# Patient Record
Sex: Male | Born: 1970 | Race: White | Hispanic: No | Marital: Married | Smoking: Current every day smoker
Health system: Southern US, Community
[De-identification: ages and names within clinical notes are randomized; demographics above are authoritative.]

## PROBLEM LIST (undated history)

## (undated) DIAGNOSIS — K3580 Unspecified acute appendicitis: Secondary | ICD-10-CM

## (undated) DIAGNOSIS — T884XXA Failed or difficult intubation, initial encounter: Secondary | ICD-10-CM

## (undated) HISTORY — DX: Unspecified acute appendicitis: K35.80

---

## 2015-05-06 DIAGNOSIS — R29898 Other symptoms and signs involving the musculoskeletal system: Secondary | ICD-10-CM | POA: Insufficient documentation

## 2017-08-04 ENCOUNTER — Observation Stay
Admission: EM | Admit: 2017-08-04 | Discharge: 2017-08-07 | Disposition: A | Discharge status: Home / Self Care | Payer: Managed Care, Other (non HMO) | Attending: Surgery | Admitting: Surgery

## 2017-08-04 ENCOUNTER — Other Ambulatory Visit: Payer: Self-pay

## 2017-08-04 DIAGNOSIS — K3580 Unspecified acute appendicitis: Secondary | ICD-10-CM | POA: Diagnosis present

## 2017-08-04 DIAGNOSIS — K353 Acute appendicitis with localized peritonitis, without perforation or gangrene: Secondary | ICD-10-CM

## 2017-08-04 DIAGNOSIS — G8929 Other chronic pain: Secondary | ICD-10-CM | POA: Insufficient documentation

## 2017-08-04 DIAGNOSIS — Z6838 Body mass index (BMI) 38.0-38.9, adult: Secondary | ICD-10-CM | POA: Diagnosis not present

## 2017-08-04 DIAGNOSIS — K219 Gastro-esophageal reflux disease without esophagitis: Secondary | ICD-10-CM | POA: Diagnosis not present

## 2017-08-04 DIAGNOSIS — E669 Obesity, unspecified: Secondary | ICD-10-CM | POA: Diagnosis not present

## 2017-08-04 DIAGNOSIS — K3532 Acute appendicitis with perforation and localized peritonitis, without abscess: Principal | ICD-10-CM | POA: Insufficient documentation

## 2017-08-04 DIAGNOSIS — J449 Chronic obstructive pulmonary disease, unspecified: Secondary | ICD-10-CM | POA: Insufficient documentation

## 2017-08-04 DIAGNOSIS — F1911 Other psychoactive substance abuse, in remission: Secondary | ICD-10-CM | POA: Insufficient documentation

## 2017-08-04 DIAGNOSIS — F17210 Nicotine dependence, cigarettes, uncomplicated: Secondary | ICD-10-CM | POA: Insufficient documentation

## 2017-08-04 HISTORY — DX: Failed or difficult intubation, initial encounter: T88.4XXA

## 2017-08-04 LAB — COMPREHENSIVE METABOLIC PANEL
ALT: 25 U/L (ref 17–63)
ANION GAP: 13 (ref 5–15)
AST: 26 U/L (ref 15–41)
Albumin: 4.3 g/dL (ref 3.5–5.0)
Alkaline Phosphatase: 63 U/L (ref 38–126)
BILIRUBIN TOTAL: 0.4 mg/dL (ref 0.3–1.2)
BUN: 16 mg/dL (ref 6–20)
CALCIUM: 9.1 mg/dL (ref 8.9–10.3)
CO2: 20 mmol/L — AB (ref 22–32)
Chloride: 105 mmol/L (ref 101–111)
Creatinine, Ser: 0.99 mg/dL (ref 0.61–1.24)
Glucose, Bld: 143 mg/dL — ABNORMAL HIGH (ref 65–99)
Potassium: 3.9 mmol/L (ref 3.5–5.1)
SODIUM: 138 mmol/L (ref 135–145)
TOTAL PROTEIN: 7.6 g/dL (ref 6.5–8.1)

## 2017-08-04 LAB — URINALYSIS, COMPLETE (UACMP) WITH MICROSCOPIC
Bilirubin Urine: NEGATIVE
GLUCOSE, UA: NEGATIVE mg/dL
HGB URINE DIPSTICK: NEGATIVE
KETONES UR: 5 mg/dL — AB
LEUKOCYTES UA: NEGATIVE
NITRITE: NEGATIVE
PH: 5 (ref 5.0–8.0)
Protein, ur: NEGATIVE mg/dL
Specific Gravity, Urine: 1.028 (ref 1.005–1.030)

## 2017-08-04 LAB — CBC
HCT: 46.7 % (ref 40.0–52.0)
HEMOGLOBIN: 16.1 g/dL (ref 13.0–18.0)
MCH: 29.8 pg (ref 26.0–34.0)
MCHC: 34.6 g/dL (ref 32.0–36.0)
MCV: 86.2 fL (ref 80.0–100.0)
Platelets: 202 10*3/uL (ref 150–440)
RBC: 5.42 MIL/uL (ref 4.40–5.90)
RDW: 13 % (ref 11.5–14.5)
WBC: 15.2 10*3/uL — AB (ref 3.8–10.6)

## 2017-08-04 LAB — LIPASE, BLOOD: Lipase: 33 U/L (ref 11–51)

## 2017-08-04 MED ORDER — ONDANSETRON HCL 4 MG/2ML IJ SOLN
4.0000 mg | Freq: Once | INTRAMUSCULAR | Status: AC | PRN
  Administered 2017-08-04: 4 mg via INTRAVENOUS
  Filled 2017-08-04: qty 2

## 2017-08-04 NOTE — ED Provider Notes (Signed)
[facility] Emergency Department Provider Note  ____________________________________________   First MD Initiated Contact with Patient 08/04/17 2328     (approximate)  I have reviewed the triage vital signs and the nursing notes.   HISTORY  Chief Complaint Abdominal Pain    HPI Robert Dean is a 47 y.o. male who comes to the emergency department with left lower quadrant pain that began insidiously around 2 PM.  Patient's pain has been slowly progressive and is now severe.  It is associated with nausea but no vomiting.  He also reports left flank pain.    It is colicky.  Nothing particular seems to make it better or worse.  He has no history of abdominal surgeries.  He last ate anything at 1 PM.  No past medical history on file.  There are no active problems to display for this patient.     Prior to Admission medications   Not on File    Allergies Patient has no known allergies.  No family history on file.  Social History Social History   Tobacco Use  . Smoking status: Not on file  Substance Use Topics  . Alcohol use: Not on file  . Drug use: Not on file    Review of Systems Constitutional: No fever/chills Eyes: No visual changes. ENT: No sore throat. Cardiovascular: Denies chest pain. Respiratory: Denies shortness of breath. Gastrointestinal: Positive for abdominal pain.  Positive for nausea, no vomiting.  No diarrhea.  No constipation. Genitourinary: Negative for dysuria. Musculoskeletal: Positive for back pain. Skin: Negative for rash. Neurological: Negative for headaches, focal weakness or numbness.   ____________________________________________   PHYSICAL EXAM:  VITAL SIGNS: ED Triage Vitals  Enc Vitals Group     BP 08/04/17 2246 (!) 154/88     Pulse Rate 08/04/17 2246 80     Resp 08/04/17 2246 (!) 22     Temp 08/04/17 2246 97.7 F (36.5 C)     Temp Source 08/04/17 2246 Oral     SpO2 08/04/17 2246 95 %     Weight  08/04/17 2247 295 lb (133.8 kg)     Height 08/04/17 2247 6\' 2"  (1.88 m)     Head Circumference --      Peak Flow --      Pain Score 08/04/17 2246 10     Pain Loc --      Pain Edu? --      Excl. in GC? --     Constitutional: Alert and oriented x4 turns onto his right side holding his left lower quadrant appears miserable Eyes: PERRL EOMI. Head: Atraumatic. Nose: No congestion/rhinnorhea. Mouth/Throat: No trismus Neck: No stridor.   Cardiovascular: Normal rate, regular rhythm. Grossly normal heart sounds.  Good peripheral circulation. Respiratory: Normal respiratory effort.  No retractions. Lungs CTAB and moving good air Gastrointestinal: Morbidly obese soft nontender no rebound or guarding no peritonitis no McBurney's tenderness negative Rovsing's no costovertebral tenderness Musculoskeletal: No lower extremity edema   Neurologic:  Normal speech and language. No gross focal neurologic deficits are appreciated. Skin:  Skin is warm, dry and intact. No rash noted. Psychiatric: Mood and affect are normal. Speech and behavior are normal.    ____________________________________________   DIFFERENTIAL includes but not limited to  Renal colic, appendicitis, diverticulitis, pyelonephritis, infected stone ____________________________________________   LABS (all labs ordered are listed, but only abnormal results are displayed)  Labs Reviewed  COMPREHENSIVE METABOLIC PANEL - Abnormal; Notable for the following components:  Result Value   CO2 20 (*)    Glucose, Bld 143 (*)    All other components within normal limits  CBC - Abnormal; Notable for the following components:   WBC 15.2 (*)    All other components within normal limits  URINALYSIS, COMPLETE (UACMP) WITH MICROSCOPIC - Abnormal; Notable for the following components:   Color, Urine YELLOW (*)    APPearance CLEAR (*)    Ketones, ur 5 (*)    Bacteria, UA RARE (*)    Squamous Epithelial / LPF 0-5 (*)    All other  components within normal limits  CULTURE, BLOOD (ROUTINE X 2)  CULTURE, BLOOD (ROUTINE X 2)  LIPASE, BLOOD  TYPE AND SCREEN    Lab work reviewed by me with elevated white count which is nonspecific __________________________________________  EKG  ED ECG REPORT I, Merrily BrittleNeil Glyn Gerads, the attending physician, personally viewed and interpreted this ECG.  Date: 08/04/2017 EKG Time:  Rate: 80 Rhythm: normal sinus rhythm QRS Axis: normal Intervals: normal ST/T Wave abnormalities: normal Narrative Interpretation: no evidence of acute ischemia.  Poor R wave progression  ____________________________________________  RADIOLOGY CT abdomen pelvis concerning for acute appendicitis  ____________________________________________   PROCEDURES  Procedure(s) performed: no  Procedures  Critical Care performed: no  Observation: no ____________________________________________   INITIAL IMPRESSION / ASSESSMENT AND PLAN / ED COURSE  Pertinent labs & imaging results that were available during my care of the patient were reviewed by me and considered in my medical decision making (see chart for details).  The patient appears miserable splinting holding his left lower quadrant.  Differential is broad but includes nephrolithiasis as well as diverticulitis and appendicitis.  Pain control and CT scan are pending.    ----------------------------------------- 1:32 AM on 08/05/2017 -----------------------------------------  CT abdomen pelvis is just back concerning for acute appendicitis.  I have consulted general surgeon Dr. Excell Seltzerooper who will kindly come consult on the case.  The patient has not eaten in 12 hours.  I will give him fluids ceftriaxone Flagyl and check a type and screen now. ____________________________________________  Dr. Excell Seltzerooper who is graciously agreed to admit the patient to his service.  FINAL CLINICAL IMPRESSION(S) / ED DIAGNOSES  Final diagnoses:  Acute appendicitis,  unspecified acute appendicitis type      NEW MEDICATIONS STARTED DURING THIS VISIT:  This SmartLink is deprecated. Use AVSMEDLIST instead to display the medication list for a patient.   Note:  This document was prepared using Dragon voice recognition software and may include unintentional dictation errors.     Merrily Brittleifenbark, Noell Shular, MD 08/05/17 [phone]

## 2017-08-04 NOTE — ED Notes (Signed)
Pt states he does have nausea when pain increases but denies and vomiting or diarrhea.

## 2017-08-04 NOTE — ED Triage Notes (Signed)
[facility] for abdominal pain since 2:30 pm.  Described as burning with nausea.  EMS place saline loc to left hand with 20g angiocath.  VS:  HR - 70; BP 174 94: pulse oxi 96% on room air, CBG 139; 12 lead with in normal limits.

## 2017-08-05 ENCOUNTER — Observation Stay: Payer: Managed Care, Other (non HMO) | Admitting: Anesthesiology

## 2017-08-05 ENCOUNTER — Other Ambulatory Visit: Payer: Self-pay

## 2017-08-05 ENCOUNTER — Encounter
Admission: EM | Disposition: A | Discharge status: Home / Self Care | Payer: Self-pay | Source: Home / Self Care | Attending: Emergency Medicine

## 2017-08-05 ENCOUNTER — Emergency Department: Payer: Managed Care, Other (non HMO)

## 2017-08-05 DIAGNOSIS — K353 Acute appendicitis with localized peritonitis, without perforation or gangrene: Secondary | ICD-10-CM

## 2017-08-05 DIAGNOSIS — K3580 Unspecified acute appendicitis: Secondary | ICD-10-CM

## 2017-08-05 HISTORY — PX: APPENDECTOMY: SHX54

## 2017-08-05 HISTORY — PX: LAPAROSCOPIC APPENDECTOMY: SHX408

## 2017-08-05 LAB — SURGICAL PCR SCREEN
MRSA, PCR: NEGATIVE
Staphylococcus aureus: NEGATIVE

## 2017-08-05 LAB — TYPE AND SCREEN
ABO/RH(D): A POS
ANTIBODY SCREEN: NEGATIVE

## 2017-08-05 SURGERY — APPENDECTOMY, LAPAROSCOPIC
Anesthesia: General

## 2017-08-05 MED ORDER — DEXAMETHASONE SODIUM PHOSPHATE 10 MG/ML IJ SOLN
INTRAMUSCULAR | Status: AC
  Filled 2017-08-05: qty 1

## 2017-08-05 MED ORDER — DEXAMETHASONE SODIUM PHOSPHATE 10 MG/ML IJ SOLN
INTRAMUSCULAR | Status: DC | PRN
  Administered 2017-08-05: 10 mg via INTRAVENOUS

## 2017-08-05 MED ORDER — ENOXAPARIN SODIUM 40 MG/0.4ML ~~LOC~~ SOLN
40.0000 mg | SUBCUTANEOUS | Status: DC
  Administered 2017-08-06 – 2017-08-07 (×2): 40 mg via SUBCUTANEOUS
  Filled 2017-08-05 (×2): qty 0.4

## 2017-08-05 MED ORDER — OXYCODONE HCL 5 MG PO TABS: 5.0000 mg | ORAL_TABLET | Freq: Once | ORAL | Status: DC | PRN

## 2017-08-05 MED ORDER — EPHEDRINE SULFATE 50 MG/ML IJ SOLN
INTRAMUSCULAR | Status: AC
  Filled 2017-08-05: qty 1

## 2017-08-05 MED ORDER — ONDANSETRON HCL 4 MG/2ML IJ SOLN
4.0000 mg | Freq: Four times a day (QID) | INTRAMUSCULAR | Status: DC | PRN
  Administered 2017-08-05 (×2): 4 mg via INTRAVENOUS
  Filled 2017-08-05: qty 2

## 2017-08-05 MED ORDER — SUCCINYLCHOLINE CHLORIDE 20 MG/ML IJ SOLN
INTRAMUSCULAR | Status: DC | PRN
  Administered 2017-08-05: 100 mg via INTRAVENOUS

## 2017-08-05 MED ORDER — BUPIVACAINE HCL (PF) 0.5 % IJ SOLN
INTRAMUSCULAR | Status: AC
  Filled 2017-08-05: qty 30

## 2017-08-05 MED ORDER — GLYCOPYRROLATE 0.2 MG/ML IJ SOLN
INTRAMUSCULAR | Status: AC
  Filled 2017-08-05: qty 1

## 2017-08-05 MED ORDER — ROCURONIUM BROMIDE 50 MG/5ML IV SOLN
INTRAVENOUS | Status: AC
  Filled 2017-08-05: qty 1

## 2017-08-05 MED ORDER — FENTANYL CITRATE (PF) 100 MCG/2ML IJ SOLN: 25.0000 ug | INTRAMUSCULAR | Status: DC | PRN

## 2017-08-05 MED ORDER — PROPOFOL 10 MG/ML IV BOLUS
INTRAVENOUS | Status: AC
  Filled 2017-08-05: qty 40

## 2017-08-05 MED ORDER — ONDANSETRON 4 MG PO TBDP
4.0000 mg | ORAL_TABLET | Freq: Four times a day (QID) | ORAL | Status: DC | PRN
Start: 2017-08-05 — End: 2017-08-07

## 2017-08-05 MED ORDER — SUGAMMADEX SODIUM 500 MG/5ML IV SOLN
INTRAVENOUS | Status: DC | PRN
  Administered 2017-08-05: 200 mg via INTRAVENOUS

## 2017-08-05 MED ORDER — HEPARIN SODIUM (PORCINE) 5000 UNIT/ML IJ SOLN
5000.0000 [IU] | Freq: Three times a day (TID) | INTRAMUSCULAR | Status: DC
  Administered 2017-08-05: 5000 [IU] via SUBCUTANEOUS
  Filled 2017-08-05: qty 1

## 2017-08-05 MED ORDER — ACETAMINOPHEN 650 MG RE SUPP
650.0000 mg | Freq: Four times a day (QID) | RECTAL | Status: DC | PRN
  Filled 2017-08-05: qty 1

## 2017-08-05 MED ORDER — DEXTROSE IN LACTATED RINGERS 5 % IV SOLN
INTRAVENOUS | Status: DC
  Administered 2017-08-05: 04:00:00 via INTRAVENOUS
  Filled 2017-08-05 (×2): qty 1000

## 2017-08-05 MED ORDER — OXYCODONE HCL 5 MG/5ML PO SOLN: 5.0000 mg | Freq: Once | ORAL | Status: DC | PRN

## 2017-08-05 MED ORDER — DEXTROSE 5 % IV SOLN
1.0000 g | Freq: Once | INTRAVENOUS | Status: AC
  Administered 2017-08-05: 1 g via INTRAVENOUS
  Filled 2017-08-05: qty 10

## 2017-08-05 MED ORDER — BUPIVACAINE HCL 0.5 % IJ SOLN
INTRAMUSCULAR | Status: DC | PRN
  Administered 2017-08-05: 20 mL

## 2017-08-05 MED ORDER — FENTANYL CITRATE (PF) 100 MCG/2ML IJ SOLN
INTRAMUSCULAR | Status: AC
  Filled 2017-08-05: qty 2

## 2017-08-05 MED ORDER — MORPHINE SULFATE (PF) 2 MG/ML IV SOLN: 2.0000 mg | INTRAVENOUS | Status: DC | PRN

## 2017-08-05 MED ORDER — LIDOCAINE HCL (CARDIAC) 20 MG/ML IV SOLN
INTRAVENOUS | Status: DC | PRN
  Administered 2017-08-05: 100 mg via INTRAVENOUS

## 2017-08-05 MED ORDER — SODIUM CHLORIDE 0.9 % IV BOLUS (SEPSIS)
1000.0000 mL | Freq: Once | INTRAVENOUS | Status: AC
  Administered 2017-08-05: 1000 mL via INTRAVENOUS

## 2017-08-05 MED ORDER — KETOROLAC TROMETHAMINE 30 MG/ML IJ SOLN
15.0000 mg | Freq: Once | INTRAMUSCULAR | Status: AC
  Administered 2017-08-05: 15 mg via INTRAVENOUS
  Filled 2017-08-05: qty 1

## 2017-08-05 MED ORDER — OXYCODONE-ACETAMINOPHEN 5-325 MG PO TABS
1.0000 | ORAL_TABLET | ORAL | Status: DC | PRN
  Administered 2017-08-05 – 2017-08-07 (×5): 1 via ORAL
  Filled 2017-08-05: qty 2
  Filled 2017-08-05 (×4): qty 1

## 2017-08-05 MED ORDER — ROCURONIUM BROMIDE 100 MG/10ML IV SOLN
INTRAVENOUS | Status: DC | PRN
  Administered 2017-08-05: 50 mg via INTRAVENOUS

## 2017-08-05 MED ORDER — LIDOCAINE HCL (PF) 2 % IJ SOLN
INTRAMUSCULAR | Status: AC
  Filled 2017-08-05: qty 10

## 2017-08-05 MED ORDER — ONDANSETRON HCL 4 MG/2ML IJ SOLN: 4.0000 mg | Freq: Four times a day (QID) | INTRAMUSCULAR | Status: DC | PRN

## 2017-08-05 MED ORDER — KCL IN DEXTROSE-NACL 20-5-0.45 MEQ/L-%-% IV SOLN
INTRAVENOUS | Status: DC
  Administered 2017-08-05 – 2017-08-07 (×5): via INTRAVENOUS
  Filled 2017-08-05 (×8): qty 1000

## 2017-08-05 MED ORDER — ONDANSETRON HCL 4 MG/2ML IJ SOLN
INTRAMUSCULAR | Status: AC
  Filled 2017-08-05: qty 2

## 2017-08-05 MED ORDER — ONDANSETRON HCL 4 MG PO TABS: 4.0000 mg | ORAL_TABLET | Freq: Four times a day (QID) | ORAL | Status: DC | PRN

## 2017-08-05 MED ORDER — METRONIDAZOLE IN NACL 5-0.79 MG/ML-% IV SOLN
500.0000 mg | Freq: Three times a day (TID) | INTRAVENOUS | Status: DC
  Administered 2017-08-05 – 2017-08-07 (×6): 500 mg via INTRAVENOUS
  Filled 2017-08-05 (×8): qty 100

## 2017-08-05 MED ORDER — KETOROLAC TROMETHAMINE 30 MG/ML IJ SOLN
30.0000 mg | Freq: Four times a day (QID) | INTRAMUSCULAR | Status: DC
  Administered 2017-08-05 – 2017-08-07 (×8): 30 mg via INTRAVENOUS
  Filled 2017-08-05 (×8): qty 1

## 2017-08-05 MED ORDER — ONDANSETRON HCL 4 MG/2ML IJ SOLN
4.0000 mg | Freq: Once | INTRAMUSCULAR | Status: AC
Start: 2017-08-05 — End: 2017-08-05
  Administered 2017-08-05: 4 mg via INTRAVENOUS
  Filled 2017-08-05: qty 2

## 2017-08-05 MED ORDER — SUCCINYLCHOLINE CHLORIDE 20 MG/ML IJ SOLN
INTRAMUSCULAR | Status: AC
  Filled 2017-08-05: qty 1

## 2017-08-05 MED ORDER — CEFTRIAXONE SODIUM IN DEXTROSE 20 MG/ML IV SOLN
1.0000 g | Freq: Once | INTRAVENOUS | Status: AC
  Administered 2017-08-05: 1 g via INTRAVENOUS
  Filled 2017-08-05: qty 50

## 2017-08-05 MED ORDER — SEVOFLURANE IN SOLN
RESPIRATORY_TRACT | Status: AC
  Filled 2017-08-05: qty 250

## 2017-08-05 MED ORDER — LACTATED RINGERS IV SOLN
INTRAVENOUS | Status: DC | PRN
  Administered 2017-08-05: 08:00:00 via INTRAVENOUS

## 2017-08-05 MED ORDER — ACETAMINOPHEN 325 MG PO TABS
650.0000 mg | ORAL_TABLET | Freq: Four times a day (QID) | ORAL | Status: DC | PRN
  Administered 2017-08-05: 650 mg via ORAL
  Filled 2017-08-05: qty 2

## 2017-08-05 MED ORDER — PHENYLEPHRINE HCL 10 MG/ML IJ SOLN
INTRAMUSCULAR | Status: DC | PRN
  Administered 2017-08-05 (×3): 100 ug via INTRAVENOUS

## 2017-08-05 MED ORDER — MORPHINE SULFATE (PF) 4 MG/ML IV SOLN
4.0000 mg | INTRAVENOUS | Status: DC | PRN
  Administered 2017-08-05 (×2): 4 mg via INTRAVENOUS
  Filled 2017-08-05 (×2): qty 1

## 2017-08-05 MED ORDER — METRONIDAZOLE IN NACL 5-0.79 MG/ML-% IV SOLN
500.0000 mg | Freq: Once | INTRAVENOUS | Status: AC
  Administered 2017-08-05: 500 mg via INTRAVENOUS
  Filled 2017-08-05: qty 100

## 2017-08-05 MED ORDER — DEXTROSE 5 % IV SOLN
1.0000 g | INTRAVENOUS | Status: DC
  Filled 2017-08-05: qty 10

## 2017-08-05 MED ORDER — CEFTRIAXONE SODIUM 2 G IJ SOLR
2.0000 g | INTRAMUSCULAR | Status: DC
  Administered 2017-08-05 – 2017-08-06 (×2): 2 g via INTRAVENOUS
  Filled 2017-08-05 (×3): qty 2

## 2017-08-05 MED ORDER — LIDOCAINE HCL (PF) 1 % IJ SOLN
INTRAMUSCULAR | Status: AC
  Filled 2017-08-05: qty 30

## 2017-08-05 MED ORDER — MORPHINE SULFATE (PF) 10 MG/ML IV SOLN
10.0000 mg | Freq: Once | INTRAVENOUS | Status: AC
  Administered 2017-08-05: 10 mg via INTRAVENOUS
  Filled 2017-08-05: qty 1

## 2017-08-05 MED ORDER — PHENYLEPHRINE HCL 10 MG/ML IJ SOLN
INTRAMUSCULAR | Status: AC
  Filled 2017-08-05: qty 1

## 2017-08-05 MED ORDER — SUGAMMADEX SODIUM 200 MG/2ML IV SOLN
INTRAVENOUS | Status: AC
  Filled 2017-08-05: qty 2

## 2017-08-05 MED ORDER — PROPOFOL 10 MG/ML IV BOLUS
INTRAVENOUS | Status: DC | PRN
  Administered 2017-08-05: 180 mg via INTRAVENOUS

## 2017-08-05 SURGICAL SUPPLY — 42 items
BLADE SURG SZ11 CARB STEEL (BLADE) ×3 IMPLANT
CANISTER SUCT 1200ML W/VALVE (MISCELLANEOUS) ×3 IMPLANT
CANISTER SUCT 3000ML PPV (MISCELLANEOUS) IMPLANT
CHLORAPREP W/TINT 26ML (MISCELLANEOUS) ×3 IMPLANT
CUTTER FLEX LINEAR 45M (STAPLE) ×3 IMPLANT
DECANTER SPIKE VIAL GLASS SM (MISCELLANEOUS) IMPLANT
DERMABOND ADVANCED (GAUZE/BANDAGES/DRESSINGS) ×2
DERMABOND ADVANCED .7 DNX12 (GAUZE/BANDAGES/DRESSINGS) ×1 IMPLANT
ELECT REM PT RETURN 9FT ADLT (ELECTROSURGICAL) ×3
ELECTRODE REM PT RTRN 9FT ADLT (ELECTROSURGICAL) ×1 IMPLANT
GLOVE BIO SURGEON STRL SZ7 (GLOVE) ×9 IMPLANT
GLOVE BIOGEL PI IND STRL 7.5 (GLOVE) ×1 IMPLANT
GLOVE BIOGEL PI INDICATOR 7.5 (GLOVE) ×2
GOWN STRL REUS W/ TWL LRG LVL4 (GOWN DISPOSABLE) ×1 IMPLANT
GOWN STRL REUS W/ TWL XL LVL3 (GOWN DISPOSABLE) ×1 IMPLANT
GOWN STRL REUS W/TWL LRG LVL4 (GOWN DISPOSABLE) ×2
GOWN STRL REUS W/TWL XL LVL3 (GOWN DISPOSABLE) ×2
GRASPER SUT TROCAR 14GX15 (MISCELLANEOUS) ×3 IMPLANT
IRRIGATION STRYKERFLOW (MISCELLANEOUS) IMPLANT
IRRIGATOR STRYKERFLOW (MISCELLANEOUS)
IV NS 1000ML (IV SOLUTION) ×2
IV NS 1000ML BAXH (IV SOLUTION) ×1 IMPLANT
KIT RM TURNOVER STRD PROC AR (KITS) ×3 IMPLANT
LINER CANISTER 1000CC FLEX (MISCELLANEOUS) IMPLANT
NEEDLE HYPO 22GX1.5 SAFETY (NEEDLE) ×3 IMPLANT
NEEDLE INSUFFLATION 14GA 120MM (NEEDLE) ×3 IMPLANT
NS IRRIG 1000ML POUR BTL (IV SOLUTION) ×3 IMPLANT
PACK LAP CHOLECYSTECTOMY (MISCELLANEOUS) ×3 IMPLANT
POUCH SPECIMEN RETRIEVAL 10MM (ENDOMECHANICALS) ×3 IMPLANT
RELOAD 45 VASCULAR/THIN (ENDOMECHANICALS) IMPLANT
RELOAD STAPLE TA45 3.5 REG BLU (ENDOMECHANICALS) ×3 IMPLANT
SHEARS HARMONIC ACE PLUS 36CM (ENDOMECHANICALS) ×3 IMPLANT
SLEEVE ENDOPATH XCEL 5M (ENDOMECHANICALS) ×3 IMPLANT
SOL .9 NS 3000ML IRR  AL (IV SOLUTION)
SOL .9 NS 3000ML IRR UROMATIC (IV SOLUTION) IMPLANT
SUT MNCRL AB 4-0 PS2 18 (SUTURE) ×3 IMPLANT
SUT VICRYL 0 UR6 27IN ABS (SUTURE) ×3 IMPLANT
SUT VICRYL AB 3-0 FS1 BRD 27IN (SUTURE) ×3 IMPLANT
TRAY FOLEY W/METER SILVER 16FR (SET/KITS/TRAYS/PACK) ×3 IMPLANT
TROCAR XCEL 12X100 BLDLESS (ENDOMECHANICALS) ×3 IMPLANT
TROCAR XCEL NON-BLD 5MMX100MML (ENDOMECHANICALS) ×3 IMPLANT
TUBING INSUFFLATION (TUBING) ×3 IMPLANT

## 2017-08-05 NOTE — ED Notes (Signed)
Patient transported to 224 

## 2017-08-05 NOTE — Progress Notes (Signed)
15 minute call to floor. 

## 2017-08-05 NOTE — H&P (Signed)
Robert Dean is an 47 y.o. male.    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  He noticed this starting approximately 2 PM on 4 January.  He has been having this pain for approximately 12 hours.  He is never had an episode like this before.  He denies  vomiting fevers or chills but has been quite nauseated.  He has had no abdominal surgery.  Patient has no family history of appendicitis  Past medical history significant for allergies but no cardiac disease etc.  Past surgical history significant for back surgery but no abdominal surgery.  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  No past medical history on file.   No family history on file. Social History:  has no tobacco, alcohol, and drug history on file.  Allergies: Allergies no known allergies   (Not in a hospital admission)   Review of Systems  Constitutional: Negative.   HENT: Negative.   Eyes: Negative.   Respiratory: Negative.   Cardiovascular: Negative.   Gastrointestinal: Positive for abdominal pain and nausea. Negative for blood in stool, constipation, diarrhea, heartburn and vomiting.  Genitourinary: Negative.   Musculoskeletal: Negative.   Skin: Negative.   Neurological: Negative.   Endo/Heme/Allergies: Negative.   Psychiatric/Behavioral: Negative.      Physical Exam:  BP (!) 154/88 (BP Location: Left Arm)   Pulse 80   Temp 97.7 F (36.5 C) (Oral)   Resp (!) 22   Ht 6' 2" (1.88 m)   Wt 295 lb (133.8 kg)   SpO2 95%   BMI 37.88 kg/m   Physical Exam  Constitutional: He is oriented to person, place, and time and well-developed, well-nourished, and in no distress. No distress.  HENT:  Head: Normocephalic and atraumatic.  Eyes: Pupils are equal, round, and reactive to light. Right eye exhibits no discharge. Left  eye exhibits no discharge. No scleral icterus.  Neck: Normal range of motion. No JVD present.  Cardiovascular: Normal rate, regular rhythm and normal heart sounds.  Pulmonary/Chest: Effort normal and breath sounds normal. No respiratory distress. He has no wheezes. He has no rales.  Abdominal: Soft. He exhibits no distension. There is tenderness. There is guarding. There is no rebound.  Maximal tenderness at McBurney's point with some mild guarding no percussion tenderness no rebound tenderness  Musculoskeletal: Normal range of motion. He exhibits no edema or tenderness.  Lymphadenopathy:    He has no cervical adenopathy.  Neurological: He is alert and oriented to person, place, and time.  Skin: Skin is warm and dry. No rash noted. He is not diaphoretic. No erythema.  Psychiatric: Mood and affect normal.  Vitals reviewed.       Results for orders placed or performed during the hospital encounter of 08/04/17 (from the past 48 hour(s))  Lipase, blood     Status: None   Collection Time: 08/04/17 10:45 PM  Result Value Ref Range   Lipase 33 11 - 51 U/L    Comment: Performed at [facility], [address] [place], [place] [address]  Comprehensive metabolic panel     Status: Abnormal   Collection Time: 08/04/17 10:45 PM  Result Value Ref Range   Sodium 138 135 - 145 mmol/L   Potassium 3.9 3.5 - 5.1 mmol/L   Chloride 105 101 - 111 mmol/L  CO2 20 (L) 22 - 32 mmol/L   Glucose, Bld 143 (H) 65 - 99 mg/dL   BUN 16 6 - 20 mg/dL   Creatinine, Ser 0.99 0.61 - 1.24 mg/dL   Calcium 9.1 8.9 - 10.3 mg/dL   Total Protein 7.6 6.5 - 8.1 g/dL   Albumin 4.3 3.5 - 5.0 g/dL   AST 26 15 - 41 U/L   ALT 25 17 - 63 U/L   Alkaline Phosphatase 63 38 - 126 U/L   Total Bilirubin 0.4 0.3 - 1.2 mg/dL   GFR calc non Af Amer >60 >60 mL/min   GFR calc Af Amer >60 >60 mL/min    Comment: (NOTE) The eGFR has been calculated using the CKD EPI equation. This calculation has not been validated in all  clinical situations. eGFR's persistently <60 mL/min signify possible Chronic Kidney Disease.    Anion gap 13 5 - 15    Comment: Performed at [facility], [address] [place], [place] [address]  CBC     Status: Abnormal   Collection Time: 08/04/17 10:45 PM  Result Value Ref Range   WBC 15.2 (H) 3.8 - 10.6 K/uL   RBC 5.42 4.40 - 5.90 MIL/uL   Hemoglobin 16.1 13.0 - 18.0 g/dL   HCT 46.7 40.0 - 52.0 %   MCV 86.2 80.0 - 100.0 fL   MCH 29.8 26.0 - 34.0 pg   MCHC 34.6 32.0 - 36.0 g/dL   RDW 13.0 11.5 - 14.5 %   Platelets 202 150 - 440 K/uL    Comment: Performed at [facility], [address] [place], [place] [address]  Urinalysis, Complete w Microscopic     Status: Abnormal   Collection Time: 08/04/17 10:45 PM  Result Value Ref Range   Color, Urine YELLOW (A) YELLOW   APPearance CLEAR (A) CLEAR   Specific Gravity, Urine 1.028 1.005 - 1.030   pH 5.0 5.0 - 8.0   Glucose, UA NEGATIVE NEGATIVE mg/dL   Hgb urine dipstick NEGATIVE NEGATIVE   Bilirubin Urine NEGATIVE NEGATIVE   Ketones, ur 5 (A) NEGATIVE mg/dL   Protein, ur NEGATIVE NEGATIVE mg/dL   Nitrite NEGATIVE NEGATIVE   Leukocytes, UA NEGATIVE NEGATIVE   RBC / HPF 0-5 0 - 5 RBC/hpf   WBC, UA 0-5 0 - 5 WBC/hpf   Bacteria, UA RARE (A) NONE SEEN   Squamous Epithelial / LPF 0-5 (A) NONE SEEN   Mucus PRESENT     Comment: Performed at [facility], [address] [place], [place] [address]   Ct Abdomen Pelvis Wo Contrast  Result Date: 08/05/2017 CLINICAL DATA:  Initial evaluation for acute abdominal pain, suspect appendicitis. EXAM: CT ABDOMEN AND PELVIS WITHOUT CONTRAST TECHNIQUE: Multidetector CT imaging of the abdomen and pelvis was performed following the standard protocol without IV contrast. COMPARISON:  None. FINDINGS: Lower chest: Hazy subsegmental atelectasis noted dependently within the lung bases. Visualized lungs are otherwise clear. Hepatobiliary: Diffuse hypoattenuation of the liver  consistent with steatosis. Liver otherwise unremarkable. Gallbladder within normal limits. No biliary dilatation. Pancreas: Pancreas within normal limits. Spleen: Spleen within normal limits. Adrenals/Urinary Tract: Adrenal glands are normal. Kidneys equal in size without nephrolithiasis or hydronephrosis. No hydroureter. Bladder largely decompressed without abnormality. Stomach/Bowel: Stomach within normal limits. No evidence for bowel obstruction. Appendix: Location: Retrocecal. Diameter: 14 mm with periappendiceal fat stranding. Appendicolith: Present near the appendiceal base (series 2, image 57). Mucosal hyper-enhancement: Unable to assessed due to lack of IV contrast. Extraluminal gas: Absent Periappendiceal  collection: Absent No other acute inflammatory changes seen about the bowels. Vascular/Lymphatic: Intra-abdominal aorta of normal caliber. Minimal calcified plaque about the aortic bifurcation. No adenopathy. Reproductive: Prostate normal. Other: No free air or fluid. Small fat containing paraumbilical hernia noted. Musculoskeletal: No acute osseous abnormality. No worrisome lytic or blastic osseous lesions. Sequelae of prior posterior decompression noted at L3-4. IMPRESSION: 1. Findings concerning for acute appendicitis as above. No complication identified. 2. No other acute intra-abdominal or pelvic process. 3. Hepatic steatosis. Electronically Signed   By: Jeannine Boga M.D.   On: 08/05/2017 01:09     Assessment/Plan  CT scan is personally reviewed.  White blood cell count is elevated.  With his clinical findings of right lower quadrant tenderness with guarding I agree that the patient's diagnosis is most likely acute appendicitis.  He will be scheduled for laparoscopic appendectomy later this morning by Dr. Rosana Hoes.  I discussed with him the rationale for offering surgery the options of observation and the risks of bleeding infection negative laparoscopy conversion to an open procedure and  bowel injury this is all reviewed for him he understood and agreed with this plan.  Florene Glen, MD, FACS

## 2017-08-05 NOTE — Anesthesia Preprocedure Evaluation (Signed)
Anesthesia Evaluation  Patient identified by MRN, date of birth, ID band Patient awake    Reviewed: Allergy & Precautions, H&P , NPO status , Patient's Chart, lab work & pertinent test results  History of Anesthesia Complications Negative for: history of anesthetic complications  Airway Mallampati: III  TM Distance: <3 FB Neck ROM: limited    Dental  (+) Chipped, Poor Dentition, Caps   Pulmonary neg shortness of breath, COPD, Current Smoker,           Cardiovascular Exercise Tolerance: Good (-) angina(-) Past MI and (-) DOE negative cardio ROS       Neuro/Psych negative neurological ROS  negative psych ROS   GI/Hepatic negative GI ROS, Neg liver ROS, neg GERD  ,  Endo/Other  negative endocrine ROS  Renal/GU      Musculoskeletal   Abdominal   Peds  Hematology negative hematology ROS (+)   Anesthesia Other Findings Signs and symptoms suggestive of sleep apnea   History reviewed. No pertinent surgical history.  BMI    Body Mass Index:  37.88 kg/m      Reproductive/Obstetrics negative OB ROS                             Anesthesia Physical Anesthesia Plan  ASA: III  Anesthesia Plan: General ETT   Post-op Pain Management:    Induction: Intravenous  PONV Risk Score and Plan: Ondansetron, Dexamethasone and Midazolam  Airway Management Planned: Oral ETT  Additional Equipment:   Intra-op Plan:   Post-operative Plan: Extubation in OR  Informed Consent: I have reviewed the patients History and Physical, chart, labs and discussed the procedure including the risks, benefits and alternatives for the proposed anesthesia with the patient or authorized representative who has indicated his/her understanding and acceptance.   Dental Advisory Given  Plan Discussed with: Anesthesiologist, CRNA and Surgeon  Anesthesia Plan Comments: (Patient consented for risks of anesthesia including  but not limited to:  - adverse reactions to medications - damage to teeth, lips or other oral mucosa - sore throat or hoarseness - Damage to heart, brain, lungs or loss of life  Patient voiced understanding.)        Anesthesia Quick Evaluation

## 2017-08-05 NOTE — Progress Notes (Signed)
Pharmacy Antibiotic Note  Robert Dean is a 47 y.o. male admitted on 08/04/2017 with intra abdominal .  Pharmacy has been consulted for ceftriaxone dosing.  Plan: ceftriaxone 2gm iv q24h   Height: 6\' 2"  (188 cm) Weight: 295 lb (133.8 kg) IBW/kg (Calculated) : 82.2  Temp (24hrs), Avg:97.9 F (36.6 C), Min:97.6 F (36.4 C), Max:98.7 F (37.1 C)  Recent Labs  Lab 08/04/17 2245  WBC 15.2*  CREATININE 0.99    Estimated Creatinine Clearance: 138.5 mL/min (by C-G formula based on SCr of 0.99 mg/dL).    No Known Allergies  Antimicrobials this admission: Anti-infectives (From admission, onward)   Start     Dose/Rate Route Frequency Ordered Stop   08/06/17 1400  cefTRIAXone (ROCEPHIN) 1 g in dextrose 5 % 50 mL IVPB  Status:  Discontinued     1 g 100 mL/hr over 30 Minutes Intravenous Every 24 hours 08/05/17 1749 08/05/17 1750   08/06/17 1400  cefTRIAXone (ROCEPHIN) 2 g in dextrose 5 % 50 mL IVPB     2 g 100 mL/hr over 30 Minutes Intravenous Every 24 hours 08/05/17 1750     08/05/17 1800  cefTRIAXone (ROCEPHIN) 1 g in dextrose 5 % 50 mL IVPB    Comments:  FOR TOTAL 1/5 DOSE OF 2GM   1 g 100 mL/hr over 30 Minutes Intravenous  Once 08/05/17 1751     08/05/17 1745  metroNIDAZOLE (FLAGYL) IVPB 500 mg     500 mg 100 mL/hr over 60 Minutes Intravenous Every 8 hours 08/05/17 1732     08/05/17 0145  cefTRIAXone (ROCEPHIN) 1 g in dextrose 5 % 50 mL IVPB - Premix     1 g 100 mL/hr over 30 Minutes Intravenous  Once 08/05/17 0132 08/05/17 0303   08/05/17 0145  metroNIDAZOLE (FLAGYL) IVPB 500 mg     500 mg 100 mL/hr over 60 Minutes Intravenous  Once 08/05/17 0132 08/05/17 0358      Microbiology results: Recent Results (from the past 240 hour(s))  Blood Culture (routine x 2)     Status: None (Preliminary result)   Collection Time: 08/05/17  1:34 AM  Result Value Ref Range Status   Specimen Description BLOOD BLOOD LEFT HAND  Final   Special Requests   Final    BOTTLES DRAWN AEROBIC AND  ANAEROBIC Blood Culture adequate volume   Culture   Final    NO GROWTH < 12 HOURS Performed at [facility], [address] [place], [place] [address]    Report Status PENDING  Incomplete  Blood Culture (routine x 2)     Status: None (Preliminary result)   Collection Time: 08/05/17  1:34 AM  Result Value Ref Range Status   Specimen Description BLOOD BLOOD RIGHT HAND  Final   Special Requests   Final    BOTTLES DRAWN AEROBIC AND ANAEROBIC Blood Culture adequate volume   Culture   Final    NO GROWTH < 12 HOURS Performed at [facility], [address] [place], [place] [address]    Report Status PENDING  Incomplete  Surgical pcr screen     Status: None   Collection Time: 08/05/17  3:57 AM  Result Value Ref Range Status   MRSA, PCR NEGATIVE NEGATIVE Final   Staphylococcus aureus NEGATIVE NEGATIVE Final    Comment: (NOTE) The Xpert SA Assay (FDA approved for NASAL specimens in patients 45 years of age and older), is one component of a comprehensive surveillance program. It is not intended to diagnose  infection nor to guide or monitor treatment. Performed at [facility], [address] [place], [place] [address]     Thank you for allowing pharmacy to be a part of this patient's care.  Gerre Pebbles Artie Mcintyre 08/05/2017 5:51 PM

## 2017-08-05 NOTE — Anesthesia Post-op Follow-up Note (Signed)
Anesthesia QCDR form completed.        

## 2017-08-05 NOTE — Anesthesia Postprocedure Evaluation (Signed)
Anesthesia Post Note  Patient: Robert Dean  Procedure(s) Performed: APPENDECTOMY LAPAROSCOPIC (N/A )  Patient location during evaluation: PACU Anesthesia Type: General Level of consciousness: awake and alert Pain management: pain level controlled Vital Signs Assessment: post-procedure vital signs reviewed and stable Respiratory status: spontaneous breathing, nonlabored ventilation, respiratory function stable and patient connected to nasal cannula oxygen Cardiovascular status: blood pressure returned to baseline and stable Postop Assessment: no apparent nausea or vomiting Anesthetic complications: no     Last Vitals:  Vitals:   08/05/17 1049 08/05/17 1056  BP: (!) 152/95   Pulse: 98   Resp: (!) 24 14  Temp:    SpO2: 94%     Last Pain:  Vitals:   08/05/17 1045  TempSrc:   PainSc: Asleep                 Cleda MccreedyJoseph K Clennon Nasca

## 2017-08-05 NOTE — Op Note (Signed)
SURGICAL OPERATIVE REPORT  DATE OF PROCEDURE: 08/05/2017  ATTENDING Surgeon(s): Ancil Linseyavis, Jason Evan, MD  ANESTHESIA: GETA (General)  PRE-OPERATIVE DIAGNOSIS: Acute non-perforated appendicitis with localized peritonitis (K35.3)  POST-OPERATIVE DIAGNOSIS: Acute perforated appendicitis with fecal contamination and localized peritonitis (K35.3)  PROCEDURE(S):  1.) Laparoscopic appendectomy (cpt: 44970)  INTRAOPERATIVE FINDINGS: Severely inflamed perforated appendix with fecal contamination, no ascites  INTRAVENOUS FLUIDS: 800 mL crystalloid   ESTIMATED BLOOD LOSS: Minimal (<20 mL)  URINE OUTPUT: 100 mL   SPECIMENS: Appendix  IMPLANTS: None  DRAINS: None  COMPLICATIONS: None apparent  CONDITION AT END OF PROCEDURE: Hemodynamically stable and extubated  DISPOSITION OF PATIENT: PACU  INDICATIONS FOR PROCEDURE:  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 Patient described quite severe nausea, but denied any vomiting, change in bowel habits, fever/chills, CP, or SOB and reported the pain was somewhat well-controlled in the Emergency Department and hospital. All risks, benefits, and alternatives to appendectomy were discussed with the patient, all of patient's questions were answered to his expressed satisfaction, and informed consent was obtained and documented.  DETAILS OF PROCEDURE: Patient was brought to the operating suite and appropriately identified. General anesthesia was administered along with confirmation of appropriate pre-operative antibiotics, and endotracheal intubation was performed by anesthetist, along with NG/OG tube for gastric decompression. In supine position, operative site was prepped and draped in usual sterile fashion, and following a brief time out, initial 5 mm incision was made in a natural skin crease just  below the umbilicus. Fascia was then elevated, and a Verress needle was inserted and its proper position confirmed using saline meniscus test prior to abdominal insufflation.  Upon insufflation of the abdominal cavity with carbon dioxide to a well-tolerated pressure of 15 mmHg, a 5 mm peri-umbilical port followed by laparoscope were inserted and used to inspect the abdominal cavity and its contents with no injuries from insertion of the first trochar noted. Two additional trocars were inserted, a 12 mm port at the Left lower quadrant position and another 5 mm port at the suprapubic position. The table was then placed in Trendelenburg position with the Right side up, and blunt graspers were gently used to retract the bowel overlying a clearly inflamed appendix surrounded by moderately severe peri-appendiceal inflammation with minimal non-purulent ascites. Selective use of the harmonic scalpel was required to free the appendix from its surrounding peritoneal attachment, enabling it to be gently retracted by below the level of a small transmural appendiceal perforation from which fecal contamination was observed, and the base of the appendix and mesoappendix were identified in relation to the cecum. The mesoappendix was dissected from the visceral appendix and hemostasis achieved using a harmonic scalpel. Upon freeing the visceral appendix from the mesoappendix, an endostapler loaded with a standard blue tissue load was advanced across the base of the visceral appendix. Upon compressing the visceral appendix (for several seconds), additional fecal material sprayed from the mid-appendiceal perforation, and the stapler was deployed and removed from the abdominal cavity. Suction-irrigator was then used suction any visible fecal contamination, and the affected area was irrigated and suctioned. Hemostasis was confirmed, and the specimen was extracted from the abdominal cavity in a laparoscopic specimen bag.  The  intraperitoneal cavity was inspected with no additional findings. PMI laparoscopic fascial closure device was then used to re-approximate fascia at the 12 mm Left lower quadrant port site. All  ports were then removed under direct visualization, and the abdominal cavity was desuflated. All port sites were irrigated/cleaned, additional local anesthetic was injected at each incision, 3-0 Vicryl was used to re-approximate dermis at 10/12 mm port site(s), and subcuticular 4-0 Monocryl suture was used to re-approximate skin. Skin was then cleaned, dried, and sterile skin glue was applied. Patient was then safely able to be awakened, extubated, and transferred to PACU for post-operative monitoring and care.   I was present for all aspects of procedure, and there were no intra-operative complications apparent.

## 2017-08-05 NOTE — Transfer of Care (Signed)
Immediate Anesthesia Transfer of Care Note  Patient: Robert Dean  Procedure(s) Performed: Procedure(s): APPENDECTOMY LAPAROSCOPIC (N/A)  Patient Location: PACU  Anesthesia Type:General  Level of Consciousness: sedated  Airway & Oxygen Therapy: Patient Spontanous Breathing and Patient connected to face mask oxygen  Post-op Assessment: Report given to RN and Post -op Vital signs reviewed and stable  Post vital signs: Reviewed and stable  Last Vitals:  Vitals:   08/05/17 0744 08/05/17 1015  BP: 133/77 (!) 161/96  Pulse: 75 89  Resp: 19 18  Temp:  (!) 36.3 C  SpO2: 91% 96%    Complications: No apparent anesthesia complications

## 2017-08-05 NOTE — Interval H&P Note (Signed)
History and Physical Interval Note:  08/05/2017 8:13 AM  Robert EdisonAshley Amison  has presented today for surgery, with the diagnosis of appendicitis  The various methods of treatment have been discussed with the patient and family. After consideration of risks, benefits and other options for treatment, the patient has consented to  Procedure(s): APPENDECTOMY LAPAROSCOPIC (N/A) as a surgical intervention .  The patient's history has been reviewed, patient examined, no change in status, stable for surgery.  I have reviewed the patient's chart and labs.  Questions were answered to the patient's satisfaction.     Ancil LinseyJason Evan Rayanna Matusik

## 2017-08-05 NOTE — Progress Notes (Signed)
Patient has episodes of irritability and restlessness. Encouraged to relax and try to be calm.  Then patient Will doze off .

## 2017-08-05 NOTE — Anesthesia Procedure Notes (Signed)
Procedure Name: Intubation Date/Time: 08/05/2017 8:24 AM Performed by: Doreen Salvage, CRNA Pre-anesthesia Checklist: Patient identified, Patient being monitored, Timeout performed, Emergency Drugs available and Suction available Patient Re-evaluated:Patient Re-evaluated prior to induction Oxygen Delivery Method: Circle system utilized Preoxygenation: Pre-oxygenation with 100% oxygen Induction Type: IV induction Ventilation: Mask ventilation without difficulty Laryngoscope Size: Mac and 4 Grade View: Grade III Tube type: Oral Tube size: 7.5 mm Number of attempts: 1 Airway Equipment and Method: Stylet Placement Confirmation: ETT inserted through vocal cords under direct vision,  positive ETCO2 and breath sounds checked- equal and bilateral Secured at: 23 cm Tube secured with: Tape Dental Injury: Teeth and Oropharynx as per pre-operative assessment

## 2017-08-06 ENCOUNTER — Observation Stay (HOSPITAL_BASED_OUTPATIENT_CLINIC_OR_DEPARTMENT_OTHER)
Admit: 2017-08-06 | Discharge: 2017-08-06 | Disposition: A | Discharge status: Home / Self Care | Payer: Managed Care, Other (non HMO) | Attending: Surgery | Admitting: Surgery

## 2017-08-06 ENCOUNTER — Encounter: Payer: Self-pay | Admitting: Surgery

## 2017-08-06 DIAGNOSIS — R7881 Bacteremia: Secondary | ICD-10-CM | POA: Diagnosis not present

## 2017-08-06 LAB — BLOOD CULTURE ID PANEL (REFLEXED)

## 2017-08-06 MED ORDER — OXYCODONE-ACETAMINOPHEN 5-325 MG PO TABS: 1.0000 | ORAL_TABLET | ORAL | 0 refills | Status: DC | PRN

## 2017-08-06 MED ORDER — VANCOMYCIN HCL IN DEXTROSE 1-5 GM/200ML-% IV SOLN
1000.0000 mg | Freq: Three times a day (TID) | INTRAVENOUS | Status: DC
  Administered 2017-08-06 – 2017-08-07 (×4): 1000 mg via INTRAVENOUS
  Filled 2017-08-06 (×5): qty 200

## 2017-08-06 MED ORDER — CIPROFLOXACIN HCL 500 MG PO TABS: 500.0000 mg | ORAL_TABLET | Freq: Two times a day (BID) | ORAL | 0 refills | Status: DC

## 2017-08-06 MED ORDER — METRONIDAZOLE 500 MG PO TABS: 500.0000 mg | ORAL_TABLET | Freq: Three times a day (TID) | ORAL | 0 refills | Status: DC

## 2017-08-06 MED ORDER — VANCOMYCIN HCL IN DEXTROSE 1-5 GM/200ML-% IV SOLN
1000.0000 mg | Freq: Once | INTRAVENOUS | Status: AC
  Administered 2017-08-06: 1000 mg via INTRAVENOUS
  Filled 2017-08-06: qty 200

## 2017-08-06 NOTE — Progress Notes (Signed)
*  PRELIMINARY RESULTS* Echocardiogram 2D Echocardiogram has been performed. A Limited Echo was performed in the Echo Lab due to patient becoming very ill & sweaty.  Robert Dean Robert Dean Robert Dean Robert Dean Robert Dean 08/06/2017, 5:50 PM

## 2017-08-06 NOTE — Progress Notes (Signed)
PHARMACY - PHYSICIAN COMMUNICATION CRITICAL VALUE ALERT - BLOOD CULTURE IDENTIFICATION (BCID)  Robert Dean is an 47 y.o. male who presented to [facility] on 08/04/2017 with a chief complaint of right lower quadrant abdominal pain  Assessment:  Tachycardic, tachypneic, systolic 100's, appendicitis (include suspected source if known)  Name of physician (or Provider) Contacted: Pavan pyreddy  Current antibiotics: metronidazole, ceftriaxone  Changes to prescribed antibiotics recommended:  Recommendations accepted by provider -- adding vancomycin for possible MRSA; will continue to follow Cx's/sensitivities and adjust regimen as necessary; 1/4 GPC   Results for orders placed or performed during the hospital encounter of 08/04/17  Blood Culture ID Panel (Reflexed) (Collected: 08/05/2017  1:34 AM)  Result Value Ref Range   Enterococcus species NOT DETECTED NOT DETECTED   Listeria monocytogenes NOT DETECTED NOT DETECTED   Staphylococcus species DETECTED (A) NOT DETECTED   Staphylococcus aureus NOT DETECTED NOT DETECTED   Methicillin resistance DETECTED (A) NOT DETECTED   Streptococcus species NOT DETECTED NOT DETECTED   Streptococcus agalactiae NOT DETECTED NOT DETECTED   Streptococcus pneumoniae NOT DETECTED NOT DETECTED   Streptococcus pyogenes NOT DETECTED NOT DETECTED   Acinetobacter baumannii NOT DETECTED NOT DETECTED   Enterobacteriaceae species NOT DETECTED NOT DETECTED   Enterobacter cloacae complex NOT DETECTED NOT DETECTED   Escherichia coli NOT DETECTED NOT DETECTED   Klebsiella oxytoca NOT DETECTED NOT DETECTED   Klebsiella pneumoniae NOT DETECTED NOT DETECTED   Proteus species NOT DETECTED NOT DETECTED   Serratia marcescens NOT DETECTED NOT DETECTED   Haemophilus influenzae NOT DETECTED NOT DETECTED   Neisseria meningitidis NOT DETECTED NOT DETECTED   Pseudomonas aeruginosa NOT DETECTED NOT DETECTED   Candida albicans NOT DETECTED NOT DETECTED   Candida glabrata NOT DETECTED  NOT DETECTED   Candida krusei NOT DETECTED NOT DETECTED   Candida parapsilosis NOT DETECTED NOT DETECTED   Candida tropicalis NOT DETECTED NOT DETECTED    Thomasene Rippleavid Kriss Perleberg, PharmD, BCPS Clinical Pharmacist 08/06/2017

## 2017-08-06 NOTE — Discharge Instructions (Signed)
In addition to included general post-operative instructions for Laparoscopic Appendectomy,  Diet: Resume home heart healthy diet.   Activity: No heavy lifting >20 pounds (children, pets, laundry, garbage) or strenuous activity until follow-up, but light activity and walking are encouraged. Do not drive or drink alcohol if taking narcotic pain medications.  Wound care: 2 days after surgery (Monday, 1/7), may shower/get incision wet with soapy water and pat dry (do not rub incisions), but no baths or submerging incision underwater until follow-up.   Medications: Resume all home medications AND complete course of prescribed antibiotics even if feeling better/well. For mild to moderate pain: acetaminophen (Tylenol) or ibuprofen/naproxen (if no kidney disease). Combining Tylenol with alcohol can substantially increase your risk of causing liver disease. Narcotic pain medications, if prescribed, can be used for severe pain, though may cause nausea, constipation, and drowsiness. Do not combine Tylenol and Percocet (or similar) within a 6 hour period as Percocet (and similar) contain(s) Tylenol. If you do not need the narcotic pain medication, you do not need to fill the prescription.  Call office [phone]) at any time if any questions, worsening pain, fevers/chills, bleeding, drainage from incision site, or other concerns.

## 2017-08-06 NOTE — Progress Notes (Signed)
Pharmacy Antibiotic Note  Robert Dean is a 47 y.o. male admitted on 08/04/2017 with bacteremia.  Pharmacy has been consulted for vancomcyin dosing.  Plan: Patient recieved vanc 1g IV x 1  Will f/u w/ vanc 1g IV q8h w/ 6 hour stack dose Will draw vanc trough 01/07 @ 1100. Will f/u w/ cx's/sensitivities and adjust abx regimens as necessary  Ke 0.108 T1/2 8 hrs Goal trough 15 - 20 mcg/mL  Height: 6\' 2"  (188 cm) Weight: 295 lb (133.8 kg) IBW/kg (Calculated) : 82.2  Temp (24hrs), Avg:98.1 F (36.7 C), Min:97.7 F (36.5 C), Max:98.7 F (37.1 C)  Recent Labs  Lab 08/04/17 2245  WBC 15.2*  CREATININE 0.99    Estimated Creatinine Clearance: 138.5 mL/min (by C-G formula based on SCr of 0.99 mg/dL).    No Known Allergies  Antimicrobials this admission: 01/05 CTX >>  01/05 Flagyl >> 01/06 Vanc >>    Microbiology results: 01/06 BCx: Staph species Mec+ 01/06 GS: 1/4 aerobic GPC  01/05 MRSA PCR: NG  Thank you for allowing pharmacy to be a part of this patient's care.  Thomasene Rippleavid Berry Gallacher, PharmD, BCPS Clinical Pharmacist 08/06/2017

## 2017-08-06 NOTE — Progress Notes (Addendum)
SURGICAL PROGRESS NOTE  Hospital Day(s): 0.   Post op day(s): 1 Day Post-Op.   Interval History: Patient seen and examined, no acute events or new complaints overnight. Patient reports moderate Left-sided peri-incisional abdominal pain overall controlled with pain medications, describes tolerating his diet without N/V, denies fever/chills (wife says he "gets hot easily"), CP, or SOB. He ambulated only once in his room to the bathroom.  Review of Systems:  Constitutional: denies fever, chills  HEENT: denies cough or congestion  Respiratory: denies any shortness of breath  Cardiovascular: denies chest pain or palpitations  Gastrointestinal: abdominal pain, N/V, and bowel function as per interval history Genitourinary: denies burning with urination or urinary frequency Musculoskeletal: denies pain, decreased motor or sensation Integumentary: denies any other rashes or skin discolorations except post-surgical abdominal wounds Neurological: denies HA or vision/hearing changes   Vital signs in last 24 hours: [min-max] current  Temp:  [97.9 F (36.6 C)-98.5 F (36.9 C)] 98.5 F (36.9 C) (01/06 1231) Pulse Rate:  [66-86] 86 (01/06 1231) Resp:  [20] 20 (01/06 1231) BP: (108-128)/(46-78) 128/77 (01/06 1231) SpO2:  [92 %-94 %] 92 % (01/06 1231)     Height: 6\' 2"  (188 cm) Weight: 295 lb (133.8 kg) BMI (Calculated): 37.86   Intake/Output this shift:  Total I/O In: 411 [I.V.:411] Out: 150 [Urine:150]   Intake/Output last 2 shifts:  @IOLAST2SHIFTS @   Physical Exam:  Constitutional: alert, cooperative and no distress  HENT: normocephalic without obvious abnormality  Eyes: PERRL, EOM's grossly intact and symmetric  Neuro: CN II - XII grossly intact and symmetric without deficit  Respiratory: breathing non-labored at rest  Cardiovascular: regular rate and sinus rhythm  Gastrointestinal: soft and obese, but non-distended, with moderate Left-sided peri-incisional tenderness to palpation,  otherwise non-tender with incisions well-approximated without any peri-incisional erythema or drainage Musculoskeletal: UE and LE FROM, no edema or wounds, motor and sensation grossly intact, NT   Labs:  CBC Latest Ref Rng & Units 08/04/2017  WBC 3.8 - 10.6 K/uL 15.2(H)  Hemoglobin 13.0 - 18.0 g/dL 96.016.1  Hematocrit 45.440.0 - 52.0 % 46.7  Platelets 150 - 440 K/uL 202   CMP Latest Ref Rng & Units 08/04/2017  Glucose 65 - 99 mg/dL 098(J143(H)  BUN 6 - 20 mg/dL 16  Creatinine 1.910.61 - 4.781.24 mg/dL 2.950.99  Sodium 621135 - 308145 mmol/L 138  Potassium 3.5 - 5.1 mmol/L 3.9  Chloride 101 - 111 mmol/L 105  CO2 22 - 32 mmol/L 20(L)  Calcium 8.9 - 10.3 mg/dL 9.1  Total Protein 6.5 - 8.1 g/dL 7.6  Total Bilirubin 0.3 - 1.2 mg/dL 0.4  Alkaline Phos 38 - 126 U/L 63  AST 15 - 41 U/L 26  ALT 17 - 63 U/L 25   Results for orders placed or performed during the hospital encounter of 08/04/17  Blood Culture (routine x 2)     Status: None (Preliminary result)   Collection Time: 08/05/17  1:34 AM  Result Value Ref Range Status   Specimen Description   Final    BLOOD BLOOD LEFT HAND Performed at [facility], [address] [place], [place] [address]    Special Requests   Final    BOTTLES DRAWN AEROBIC AND ANAEROBIC Blood Culture adequate volume Performed at [facility], [address] [place], [place] [address]    Culture  Setup Time   Final    GRAM POSITIVE COCCI AEROBIC BOTTLE ONLY CRITICAL RESULT CALLED TO, READ BACK BY AND VERIFIED WITH: DAVID BESANTI AT  4098 08/06/17.PMH    Culture GRAM POSITIVE COCCI  Final   Report Status PENDING  Incomplete  Blood Culture (routine x 2)     Status: None (Preliminary result)   Collection Time: 08/05/17  1:34 AM  Result Value Ref Range Status   Specimen Description BLOOD BLOOD RIGHT HAND  Final   Special Requests   Final    BOTTLES DRAWN AEROBIC AND ANAEROBIC Blood Culture adequate volume   Culture   Final    NO GROWTH 1 DAY Performed at [facility], [address] [place], [place] [address]    Report Status PENDING  Incomplete  Blood Culture ID Panel (Reflexed)     Status: Abnormal   Collection Time: 08/05/17  1:34 AM  Result Value Ref Range Status   Enterococcus species NOT DETECTED NOT DETECTED Final   Listeria monocytogenes NOT DETECTED NOT DETECTED Final   Staphylococcus species DETECTED (A) NOT DETECTED Final    Comment: Methicillin (oxacillin) resistant coagulase negative staphylococcus. Possible blood culture contaminant (unless isolated from more than one blood culture draw or clinical case suggests pathogenicity). No antibiotic treatment is indicated for blood  culture contaminants. CRITICAL RESULT CALLED TO, READ BACK BY AND VERIFIED WITH: DAVID BESANTI AT 7829 08/06/17.PMH    Staphylococcus aureus NOT DETECTED NOT DETECTED Final   Methicillin resistance DETECTED (A) NOT DETECTED Final    Comment: CRITICAL RESULT CALLED TO, READ BACK BY AND VERIFIED WITH: DAVID BESANTI AT 5621 08/06/17.PMH    Streptococcus species NOT DETECTED NOT DETECTED Final   Streptococcus agalactiae NOT DETECTED NOT DETECTED Final   Streptococcus pneumoniae NOT DETECTED NOT DETECTED Final   Streptococcus pyogenes NOT DETECTED NOT DETECTED Final   Acinetobacter baumannii NOT DETECTED NOT DETECTED Final   Enterobacteriaceae species NOT DETECTED NOT DETECTED Final   Enterobacter cloacae complex NOT DETECTED NOT DETECTED Final   Escherichia coli NOT DETECTED NOT DETECTED Final   Klebsiella oxytoca NOT DETECTED NOT DETECTED Final   Klebsiella pneumoniae NOT DETECTED NOT DETECTED Final   Proteus species NOT DETECTED NOT DETECTED Final   Serratia marcescens NOT DETECTED NOT DETECTED Final   Haemophilus influenzae NOT DETECTED NOT DETECTED Final   Neisseria meningitidis NOT DETECTED NOT DETECTED Final   Pseudomonas aeruginosa NOT DETECTED NOT DETECTED Final   Candida albicans NOT DETECTED NOT DETECTED Final   Candida glabrata NOT DETECTED  NOT DETECTED Final   Candida krusei NOT DETECTED NOT DETECTED Final   Candida parapsilosis NOT DETECTED NOT DETECTED Final   Candida tropicalis NOT DETECTED NOT DETECTED Final    Comment: Performed at [facility], [address] [place], [place] [address]  Surgical pcr screen     Status: None   Collection Time: 08/05/17  3:57 AM  Result Value Ref Range Status   MRSA, PCR NEGATIVE NEGATIVE Final   Staphylococcus aureus NEGATIVE NEGATIVE Final    Comment: (NOTE) The Xpert SA Assay (FDA approved for NASAL specimens in patients 75 years of age and older), is one component of a comprehensive surveillance program. It is not intended to diagnose infection nor to guide or monitor treatment. Performed at [facility], [address], [place], [place] [address]     Imaging studies: No new pertinent imaging studies   Assessment/Plan: (ICD-10's: K35.3, B95.61) 47 y.o. male with pre-operative staph bacteremia (apparently checked in ED prior to admission) for which vancomycin was added by medical physician overnight 1 Day Post-Op s/p laparoscopic appendectomy for perforated acute appendicitis with feculent contamination, complicated by  pertinent comorbidities including obesity (BMI 38), chronic back pain, chronic ongoing tobacco abuse (smoking), and a history of IV drug abuse.   - advance diet as tolerated  - pain control prn (minimize narcotics)   - IV antibiotics to oral antibiotics x 7 days for perforated appendicitis  - had planned on discharge likely later today after afternoon ceftriaxone   - echo ordered considering staph bacteremia with history IV drug abuse and prior staph bacteremia ([facility], 2016)  - will continue Vancomycin and follow-up pending cultures  - anticipate discharge likely soon, possibly tomorrow  - DVT prophylaxis, ambulation encouraged   All of the above findings and recommendations were discussed with the patient, patient's wife, and patient's RN,  and all of patient's and family's questions were answered to their expressed satisfaction.  -- Scherrie Gerlach Earlene Plater, MD, RPVI [place]: [facility] - Partnering for exceptional care. Office: [phone]

## 2017-08-07 LAB — BASIC METABOLIC PANEL
Anion gap: 7 (ref 5–15)
BUN: 11 mg/dL (ref 6–20)
CO2: 24 mmol/L (ref 22–32)
CREATININE: 0.79 mg/dL (ref 0.61–1.24)
Calcium: 8.4 mg/dL — ABNORMAL LOW (ref 8.9–10.3)
Chloride: 107 mmol/L (ref 101–111)
GFR calc Af Amer: >60 mL/min (ref >60)
GLUCOSE: 128 mg/dL — AB (ref 65–99)
Potassium: 3.2 mmol/L — ABNORMAL LOW (ref 3.5–5.1)
Sodium: 138 mmol/L (ref 135–145)

## 2017-08-07 LAB — CBC
HCT: 40.2 % (ref 40.0–52.0)
Hemoglobin: 13.5 g/dL (ref 13.0–18.0)
MCH: 29.7 pg (ref 26.0–34.0)
MCHC: 33.6 g/dL (ref 32.0–36.0)
MCV: 88.4 fL (ref 80.0–100.0)
PLATELETS: 153 10*3/uL (ref 150–440)
RBC: 4.55 MIL/uL (ref 4.40–5.90)
RDW: 13.2 % (ref 11.5–14.5)
WBC: 13.4 10*3/uL — AB (ref 3.8–10.6)

## 2017-08-07 LAB — ECHOCARDIOGRAM COMPLETE
HEIGHTINCHES: 74 in
Weight: 4720 oz

## 2017-08-07 LAB — VANCOMYCIN, TROUGH: Vancomycin Tr: 9 ug/mL — ABNORMAL LOW (ref 15–20)

## 2017-08-07 LAB — HIV ANTIBODY (ROUTINE TESTING W REFLEX): HIV SCREEN 4TH GENERATION: NONREACTIVE

## 2017-08-07 MED ORDER — CIPROFLOXACIN HCL 500 MG PO TABS
500.0000 mg | ORAL_TABLET | Freq: Two times a day (BID) | ORAL | 0 refills | Status: AC
Start: 2017-08-07 — End: 2017-08-14

## 2017-08-07 MED ORDER — VANCOMYCIN HCL 10 G IV SOLR
1250.0000 mg | Freq: Three times a day (TID) | INTRAVENOUS | Status: DC
  Filled 2017-08-07 (×2): qty 1250

## 2017-08-07 MED ORDER — METRONIDAZOLE 500 MG PO TABS: 500.0000 mg | ORAL_TABLET | Freq: Three times a day (TID) | ORAL | 0 refills | Status: AC

## 2017-08-07 MED ORDER — OXYCODONE-ACETAMINOPHEN 5-325 MG PO TABS: 1.0000 | ORAL_TABLET | ORAL | 0 refills | Status: DC | PRN

## 2017-08-07 NOTE — Progress Notes (Signed)
2 Days Post-Op   Subjective: Patient reports continued improvement.  Has occasional discomfort with movement but otherwise eating well and having bowel function.  Currently on submental oxygen.  Vital signs in last 24 hours: Temp:  [97.9 F (36.6 C)-98.5 F (36.9 C)] 97.9 F (36.6 C) (01/07 0502) Pulse Rate:  [83-93] 93 (01/07 0502) Resp:  [18-20] 20 (01/07 0502) BP: (128-147)/(77-92) 147/92 (01/07 0502) SpO2:  [91 %-94 %] 92 % (01/07 0740) Last BM Date: 08/07/17  Intake/Output from previous day: 01/06 0701 - 01/07 0700 In: 2779.5 [P.O.:120; I.V.:2094.5; IV Piggyback:565] Out: 2325 [Urine:2325]  GI: abnormal findings:  Abdomen is large, soft, non-distended.  Dermabond in place to his multiple incisions with some ecchymosis in the left lower quadrant.  No evidence of erythema or drainage.  Lab Results:  CBC Recent Labs    08/04/17 2245 08/07/17 0834  WBC 15.2* 13.4*  HGB 16.1 13.5  HCT 46.7 40.2  PLT 202 153   CMP     Component Value Date/Time   NA 138 08/07/2017 0834   K 3.2 (L) 08/07/2017 0834   CL 107 08/07/2017 0834   CO2 24 08/07/2017 0834   GLUCOSE 128 (H) 08/07/2017 0834   BUN 11 08/07/2017 0834   CREATININE 0.79 08/07/2017 0834   CALCIUM 8.4 (L) 08/07/2017 0834   PROT 7.6 08/04/2017 2245   ALBUMIN 4.3 08/04/2017 2245   AST 26 08/04/2017 2245   ALT 25 08/04/2017 2245   ALKPHOS 63 08/04/2017 2245   BILITOT 0.4 08/04/2017 2245   GFRNONAA >60 08/07/2017 0834   GFRAA >60 08/07/2017 0834   PT/INR No results for input(s): LABPROT, INR in the last 72 hours.  Studies/Results: No results found.  Assessment/Plan: 47 year old male status post laparoscopic appendectomy for perforated appendicitis.  Doing well.  Also has evidence of MRSA from a blood culture drawn preoperatively.  Still on IV antibiotics for his perforated appendicitis and his blood culture positivity.  Discussed with the patient that we are awaiting final cultures before transitioning to oral  antibiotics.  Anticipate transition to oral antibiotics later today.  Saline lock IV fluids this morning and encourage ambulation and incentive spirometer usage.  Discussed with the nursing staff attempting to wean him off of the oxygen.  Once on oral medications and off of oxygen he will be ready for discharge.   Ricarda Frameharles Camrie Stock, MD [facility] General Surgeon [facility]  Day [facility] [phone](7a-[id number]) [phone] Night ASCOM [phone](7p-7a) [phone]  08/07/2017

## 2017-08-07 NOTE — Discharge Summary (Signed)
Patient ID: Robert Dean MRN: [id number] DOB/AGE: 45/08/1970 45 y.o.  Admit date: 08/04/2017 Discharge date: 08/07/2017  Discharge Diagnoses:  Appendicitis  Procedures Performed: Laparoscopic Appendectomy  Discharged Condition: good  Hospital Course: Patient admitted with appendicitis. Underwent a laparoscopic appendectomy for what was found to be a ruptured appendicitis. Was able to be weaned off of oxygen and tolerated all oral medications before time of discharge. Patient had a positive blood culture on admission that was thought to be a contaminate by time of discharge. Will have short term follow up.   Discharge Orders: Discharge Instructions    Call MD for:  persistant nausea and vomiting   Complete by:  As directed    Call MD for:  redness, tenderness, or signs of infection (pain, swelling, redness, odor or green/yellow discharge around incision site)   Complete by:  As directed    Call MD for:  severe uncontrolled pain   Complete by:  As directed    Call MD for:  temperature >100.4   Complete by:  As directed    Diet - low sodium heart healthy   Complete by:  As directed    Increase activity slowly   Complete by:  As directed       Disposition: HOME  Discharge Medications: Allergies as of 08/07/2017   No Known Allergies     Medication List    TAKE these medications   ciprofloxacin 500 MG tablet Commonly known as:  CIPRO Take 1 tablet (500 mg total) by mouth 2 (two) times daily for 7 days.   levocetirizine 5 MG tablet Commonly known as:  XYZAL Take 5 mg by mouth every evening.   metroNIDAZOLE 500 MG tablet Commonly known as:  FLAGYL Take 1 tablet (500 mg total) by mouth 3 (three) times daily for 7 days.   oxyCODONE-acetaminophen 5-325 MG tablet Commonly known as:  PERCOCET/ROXICET Take 1-2 tablets by mouth every 4 (four) hours as needed for severe pain.        Follwup: Follow-up Information    [facility] [place]. Schedule an  appointment as soon as possible for a visit in 2 week(s).   Specialty:  General Surgery Contact information: [address] [place] [id number] [phone]          Signed: Ricarda FrameCharles Jackson Coffield 08/07/2017, 2:44 PM

## 2017-08-07 NOTE — Progress Notes (Addendum)
Pharmacy Antibiotic Note  Robert Dean is a 47 y.o. male admitted on 08/04/2017 with bacteremia.  Pharmacy has been consulted for vancomcyin dosing.  Patient also on Ceftriaxone and metronidazole  Plan: Patient recieved vanc 1g IV x 1  Will f/u w/ vanc 1g IV q8h w/ 6 hour stack dose Will draw vanc trough 01/07 @ 1100. Will f/u w/ cx's/sensitivities and adjust abx regimens as necessary Ke 0.108 T1/2 8 hrs Goal trough 15 - 20 mcg/mL  1/7:  Vancomycin trough= 9 mcg/ml.  Ke 0.112,  T1/2 6.19   Vd 72.  Adj BW= 102.8 kg Will increase Vancomycin to 1250mg  IV q8h for goal trough 15-20 mcg/ml. Patient is at risk for accumulation due to obesity. Will check next vancomycin trough prior to 5th dose of new regimen. Follow closely to watch for saturation.   Height: 6\' 2"  (188 cm) Weight: 295 lb (133.8 kg) IBW/kg (Calculated) : 82.2  Temp (24hrs), Avg:97.9 F (36.6 C), Min:97.9 F (36.6 C), Max:98 F (36.7 C)  Recent Labs  Lab 08/04/17 2245 08/07/17 0834 08/07/17 1131  WBC 15.2* 13.4*  --   CREATININE 0.99 0.79  --   VANCOTROUGH  --   --  9*    Estimated Creatinine Clearance: 171.3 mL/min (by C-G formula based on SCr of 0.79 mg/dL).    No Known Allergies  Antimicrobials this admission: 01/05 CTX >>  01/05 Flagyl >> 01/06 Vanc >>    Microbiology results: 01/06 BCx: Staph species Mec+ 01/06 GS: 1/4 aerobic GPC  01/05 MRSA PCR: NG  Thank you for allowing pharmacy to be a part of this patient's care.  Bari MantisKristin Elsa Ploch PharmD Clinical Pharmacist 08/07/2017

## 2017-08-07 NOTE — Progress Notes (Signed)
IV was removed. Discharge instructions, follow-up appointments, and prescriptions were provided to the pt and father at bedside. The pt was taken downstairs via wheelchair by volunteers.

## 2017-08-08 LAB — SURGICAL PATHOLOGY

## 2017-08-10 LAB — BLOOD CULTURE ID PANEL (REFLEXED)
ACINETOBACTER BAUMANNII: NOT DETECTED
CANDIDA ALBICANS: NOT DETECTED
CANDIDA PARAPSILOSIS: NOT DETECTED
CANDIDA TROPICALIS: NOT DETECTED
Candida glabrata: NOT DETECTED
Candida krusei: NOT DETECTED
Enterobacter cloacae complex: NOT DETECTED
Enterobacteriaceae species: NOT DETECTED
Enterococcus species: NOT DETECTED
Escherichia coli: NOT DETECTED
HAEMOPHILUS INFLUENZAE: NOT DETECTED
KLEBSIELLA OXYTOCA: NOT DETECTED
KLEBSIELLA PNEUMONIAE: NOT DETECTED
Listeria monocytogenes: NOT DETECTED
NEISSERIA MENINGITIDIS: NOT DETECTED
PROTEUS SPECIES: NOT DETECTED
Pseudomonas aeruginosa: NOT DETECTED
STAPHYLOCOCCUS SPECIES: NOT DETECTED
STREPTOCOCCUS AGALACTIAE: NOT DETECTED
STREPTOCOCCUS SPECIES: NOT DETECTED
Serratia marcescens: NOT DETECTED
Staphylococcus aureus (BCID): NOT DETECTED
Streptococcus pneumoniae: NOT DETECTED
Streptococcus pyogenes: NOT DETECTED

## 2017-08-10 LAB — CULTURE, BLOOD (ROUTINE X 2)
Culture: NO GROWTH
Special Requests: ADEQUATE

## 2017-08-10 NOTE — Progress Notes (Signed)
Lab called with second bottle of blood cultures with GPR. BCID: no screened pathogen detected. Message sent to discharging provider.  Fulton ReekMatt Noya Santarelli, PharmD, BCPS  08/10/17 6:17 AM

## 2017-08-11 ENCOUNTER — Other Ambulatory Visit: Payer: Self-pay

## 2017-08-12 LAB — CULTURE, BLOOD (ROUTINE X 2): SPECIAL REQUESTS: ADEQUATE

## 2017-08-15 ENCOUNTER — Encounter: Payer: Self-pay | Admitting: Surgery

## 2017-08-15 ENCOUNTER — Ambulatory Visit (INDEPENDENT_AMBULATORY_CARE_PROVIDER_SITE_OTHER): Payer: Managed Care, Other (non HMO) | Admitting: Surgery

## 2017-08-15 VITALS — BP 135/89 | HR 91 | Temp 98.3°F | Ht 73.0 in | Wt 286.0 lb

## 2017-08-15 DIAGNOSIS — Z09 Encounter for follow-up examination after completed treatment for conditions other than malignant neoplasm: Secondary | ICD-10-CM

## 2017-08-15 NOTE — Patient Instructions (Addendum)
GENERAL POST-OPERATIVE PATIENT INSTRUCTIONS   WOUND CARE INSTRUCTIONS:  Keep a dry clean dressing on the wound if there is drainage. The initial bandage may be removed after 24 hours.  Once the wound has quit draining you may leave it open to air.  If clothing rubs against the wound or causes irritation and the wound is not draining you may cover it with a dry dressing during the daytime.  Try to keep the wound dry and avoid ointments on the wound unless directed to do so.  If the wound becomes bright red and painful or starts to drain infected material that is not clear, please contact your physician immediately.  If the wound is mildly pink and has a thick firm ridge underneath it, this is normal, and is referred to as a healing ridge.  This will resolve over the next 4-6 weeks.  BATHING: You may shower if you have been informed of this by your surgeon. However, Please do not submerge in a tub, hot tub, or pool until incisions are completely sealed or have been told by your surgeon that you may do so.  DIET:  You may eat any foods that you can tolerate.  It is a good idea to eat a high fiber diet and take in plenty of fluids to prevent constipation.  If you do become constipated you may want to take a mild laxative or take ducolax tablets on a daily basis until your bowel habits are regular.  Constipation can be very uncomfortable, along with straining, after recent surgery.  ACTIVITY:  You are encouraged to cough and deep breath or use your incentive spirometer if you were given one, every 15-30 minutes when awake.  This will help prevent respiratory complications and low grade fevers post-operatively if you had a general anesthetic.  You may want to hug a pillow when coughing and sneezing to add additional support to the surgical area, if you had abdominal or chest surgery, which will decrease pain during these times.  You are encouraged to walk and engage in light activity for the next two weeks.  You  should not lift more than 20 pounds, until 09/17/2017 as it could put you at increased risk for complications.  Twenty pounds is roughly equivalent to a plastic bag of groceries. At that time- Listen to your body when lifting, if you have pain when lifting, stop and then try again in a few days. Soreness after doing exercises or activities of daily living is normal as you get back in to your normal routine.  MEDICATIONS:  Try to take narcotic medications and anti-inflammatory medications, such as tylenol, ibuprofen, naprosyn, etc., with food.  This will minimize stomach upset from the medication.  Should you develop nausea and vomiting from the pain medication, or develop a rash, please discontinue the medication and contact your physician.  You should not drive, make important decisions, or operate machinery when taking narcotic pain medication.  SUNBLOCK Use sun block to incision area over the next year if this area will be exposed to sun. This helps decrease scarring and will allow you avoid a permanent darkened area over your incision.  QUESTIONS:  Please feel free to call our office if you have any questions, and we will be glad to assist you. [phone]    

## 2017-08-15 NOTE — Progress Notes (Signed)
S/p lap appy by Dr. Excell Seltzerooper Doing well No pain + PO Path d/w pt  PE NAD Incisions c/d/i, no peritonitis or infection  A/P doing well No heavy lifting RTC prn

## 2017-08-18 DIAGNOSIS — K353 Acute appendicitis with localized peritonitis, without perforation or gangrene: Secondary | ICD-10-CM

## 2017-08-21 ENCOUNTER — Telehealth: Payer: Self-pay | Admitting: Surgery

## 2017-08-21 NOTE — Telephone Encounter (Signed)
Patient came by the office and dropped off fmla and disability paperwork he needs filled out, payment has been paid and placed in folder up front.

## 2017-08-21 NOTE — Telephone Encounter (Signed)
Patient called back said paperwork needs to be faxed to Lucita FerraraBeth Graham with [employer]  fax # [phone]

## 2017-08-28 ENCOUNTER — Telehealth: Payer: Self-pay

## 2017-08-28 NOTE — Telephone Encounter (Signed)
Faxed disability paperwork to [employer] at this time. Placed in scan folder. Patient notified.

## 2018-12-24 IMAGING — CT CT ABD-PELV W/O CM
2 of 4 series · 16 of 46 positions shown, 18 images · non-contrast
Comparison: None.

CLINICAL DATA: Initial evaluation for acute abdominal pain, suspect
appendicitis.

EXAM:
CT ABDOMEN AND PELVIS WITHOUT CONTRAST
TECHNIQUE: Multidetector CT imaging of the abdomen and pelvis was performed
following the standard protocol without IV contrast.

[Series 2: routine abd/pel wo · axial · 0.88mm/px · z∈[-1098,-598]mm · 13 of 110 slices shown, 15 images]
[im 5/110  soft-tissue]
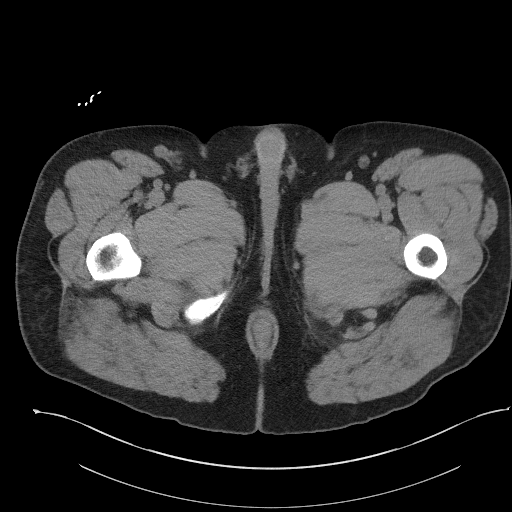
[im 5/110  bone]
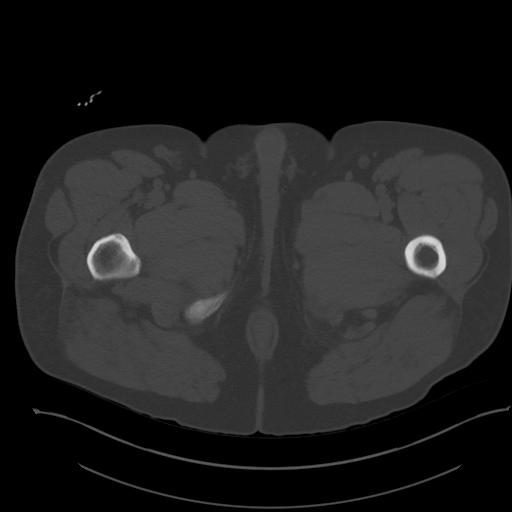
[im 14/110  soft-tissue]
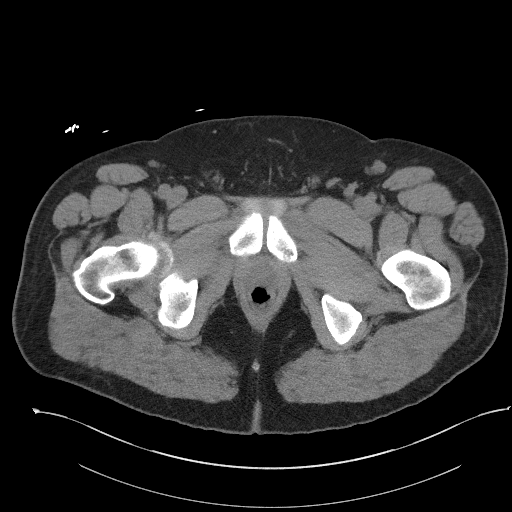
[im 22/110  soft-tissue]
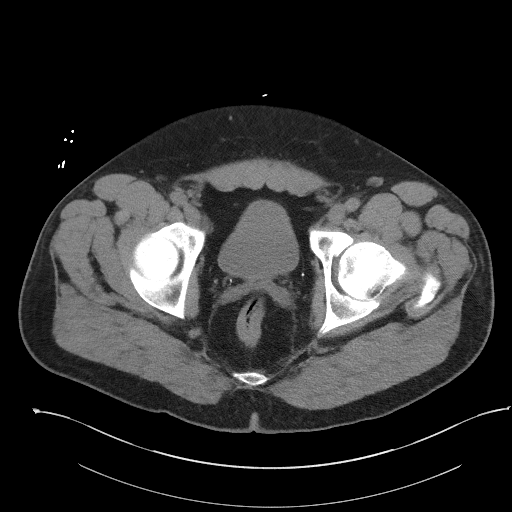
[im 31/110  soft-tissue]
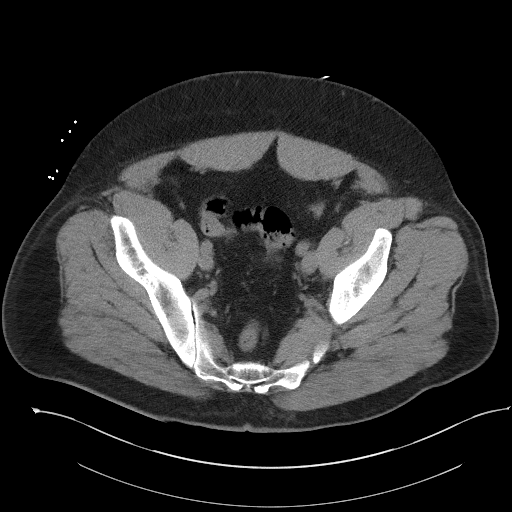
[im 40/110  soft-tissue]
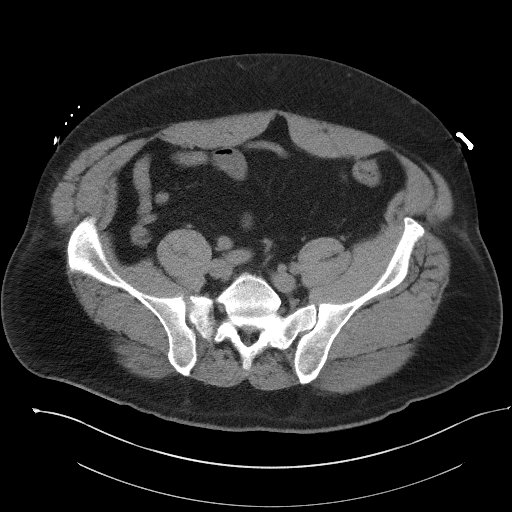
[im 48/110  soft-tissue]
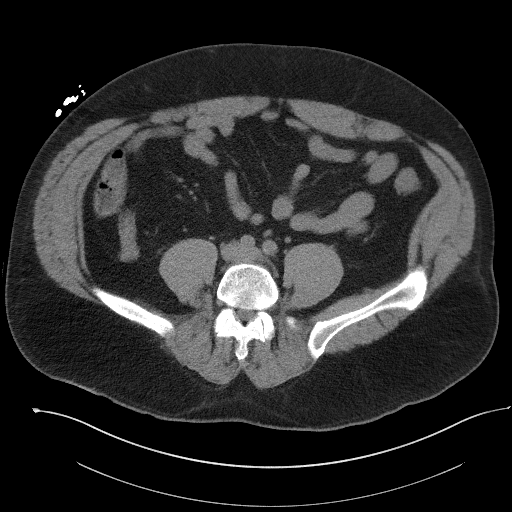
[im 57/110  soft-tissue]
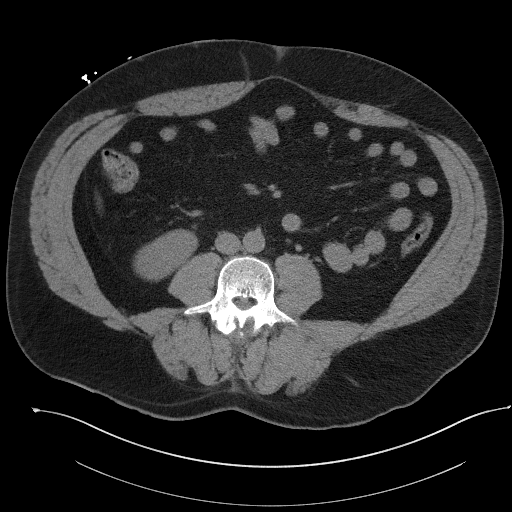
[im 62/110  soft-tissue]
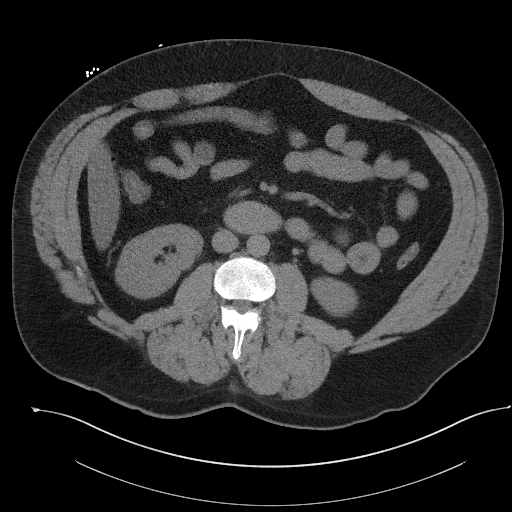
[im 70/110  soft-tissue]
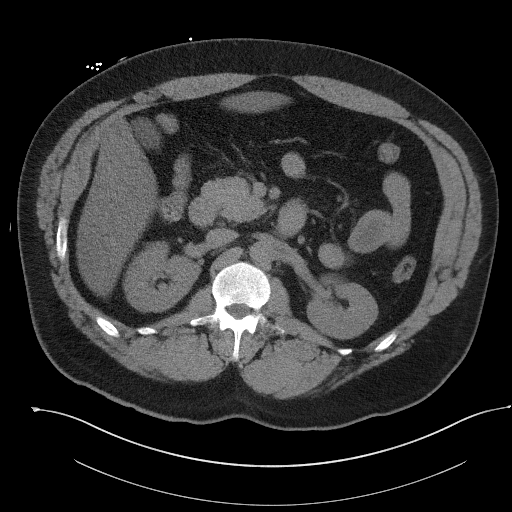
[im 70/110  bone]
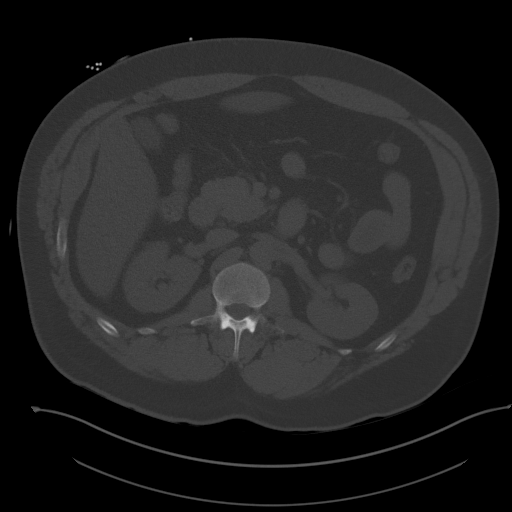
[im 79/110  soft-tissue]
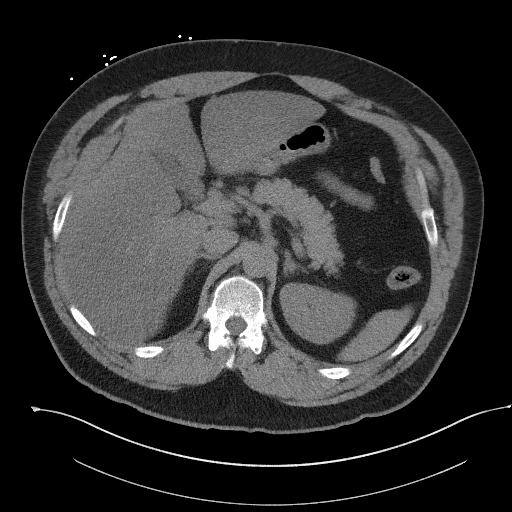
[im 88/110  soft-tissue]
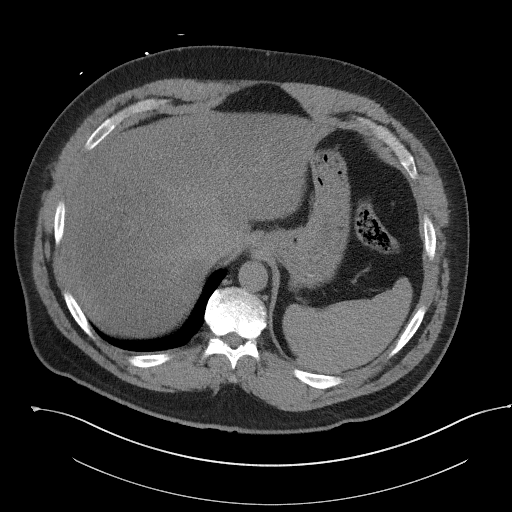
[im 96/110  soft-tissue]
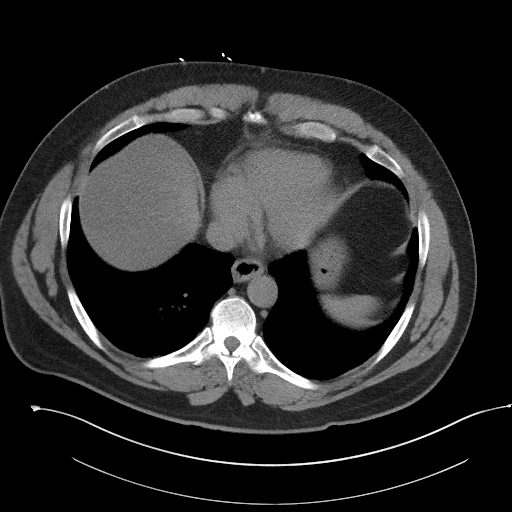
[im 105/110  soft-tissue]
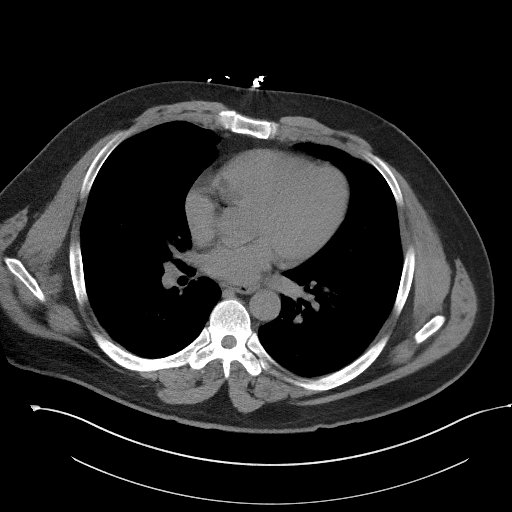

[Series 5: coronal st · coronal · 0.85mm/px · 3 of 110 slices shown]
[im 37/110  soft-tissue]
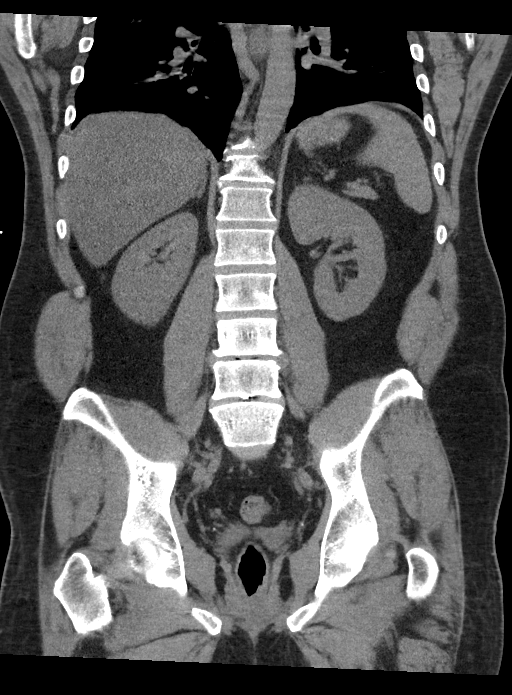
[im 49/110  soft-tissue]
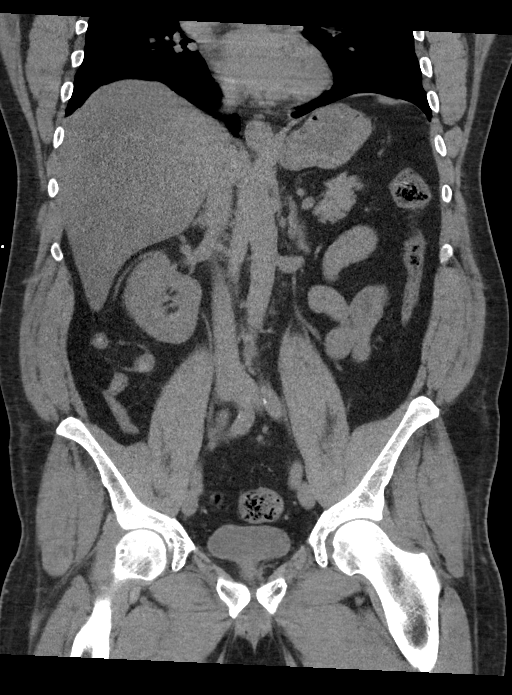
[im 61/110  soft-tissue]
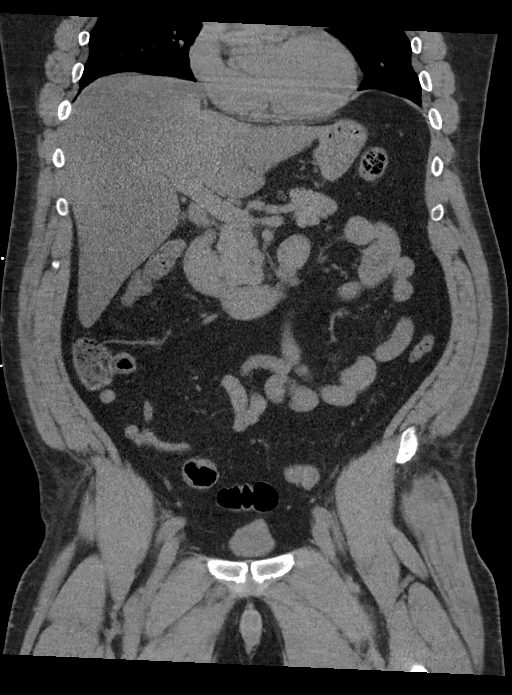

[16 of 46 positions shown; findings below may reference images not displayed]

FINDINGS: Lower chest: Hazy subsegmental atelectasis noted dependently within
the lung bases. Visualized lungs are otherwise clear.

Hepatobiliary: Diffuse hypoattenuation of the liver consistent with
steatosis. Liver otherwise unremarkable. Gallbladder within normal
limits. No biliary dilatation.

Pancreas: Pancreas within normal limits.

Spleen: Spleen within normal limits.

Adrenals/Urinary Tract: Adrenal glands are normal. Kidneys equal in
size without nephrolithiasis or hydronephrosis. No hydroureter.
Bladder largely decompressed without abnormality.

Stomach/Bowel: Stomach within normal limits. No evidence for bowel
obstruction.

Appendix: Location: Retrocecal.

Diameter: 14 mm with periappendiceal fat stranding.

Appendicolith: Present near the appendiceal base (series 2, image
57).

Mucosal hyper-enhancement: Unable to assessed due to lack of IV
contrast.

Extraluminal gas: Absent

Periappendiceal collection: Absent

No other acute inflammatory changes seen about the bowels.

Vascular/Lymphatic: Intra-abdominal aorta of normal caliber. Minimal
calcified plaque about the aortic bifurcation. No adenopathy.

Reproductive: Prostate normal.

Other: No free air or fluid. Small fat containing paraumbilical
hernia noted.

Musculoskeletal: No acute osseous abnormality. No worrisome lytic or
blastic osseous lesions. Sequelae of prior posterior decompression
noted at L3-4.
IMPRESSION: 1. Findings concerning for acute appendicitis as above. No
complication identified.
2. No other acute intra-abdominal or pelvic process.
3. Hepatic steatosis.
